# Patient Record
Sex: Male | Born: 1970 | Race: Black or African American | Hispanic: No | Marital: Single | Smoking: Never smoker
Health system: Southern US, Community
[De-identification: ages and names within clinical notes are randomized; demographics above are authoritative.]

## PROBLEM LIST (undated history)

## (undated) DIAGNOSIS — E0590 Thyrotoxicosis, unspecified without thyrotoxic crisis or storm: Secondary | ICD-10-CM

## (undated) DIAGNOSIS — E041 Nontoxic single thyroid nodule: Secondary | ICD-10-CM

## (undated) DIAGNOSIS — E119 Type 2 diabetes mellitus without complications: Secondary | ICD-10-CM

## (undated) HISTORY — PX: APPENDECTOMY: SHX54

## (undated) HISTORY — PX: ABDOMINAL SURGERY: SHX537

## (undated) HISTORY — PX: HERNIA REPAIR: SHX51

---

## 2004-01-10 ENCOUNTER — Other Ambulatory Visit: Payer: Self-pay

## 2005-02-06 ENCOUNTER — Emergency Department: Payer: Self-pay | Admitting: Emergency Medicine

## 2005-07-23 ENCOUNTER — Emergency Department: Payer: Self-pay | Admitting: Internal Medicine

## 2006-12-14 ENCOUNTER — Emergency Department: Payer: Self-pay | Admitting: Emergency Medicine

## 2007-01-11 ENCOUNTER — Emergency Department: Payer: Self-pay | Admitting: Emergency Medicine

## 2008-08-09 ENCOUNTER — Emergency Department: Payer: Self-pay | Admitting: Emergency Medicine

## 2009-02-09 ENCOUNTER — Emergency Department: Payer: Self-pay | Admitting: Internal Medicine

## 2009-02-23 ENCOUNTER — Emergency Department: Payer: Self-pay | Admitting: Emergency Medicine

## 2009-04-10 ENCOUNTER — Emergency Department: Payer: Self-pay | Admitting: Emergency Medicine

## 2009-06-09 ENCOUNTER — Emergency Department: Payer: Self-pay | Admitting: Emergency Medicine

## 2009-11-29 ENCOUNTER — Emergency Department: Payer: Self-pay | Admitting: Emergency Medicine

## 2010-05-19 ENCOUNTER — Emergency Department: Payer: Self-pay | Admitting: Emergency Medicine

## 2010-10-03 DIAGNOSIS — E0590 Thyrotoxicosis, unspecified without thyrotoxic crisis or storm: Secondary | ICD-10-CM

## 2011-03-13 ENCOUNTER — Emergency Department: Payer: Self-pay | Admitting: Emergency Medicine

## 2011-05-19 ENCOUNTER — Emergency Department: Payer: Self-pay | Admitting: Emergency Medicine

## 2013-04-21 DIAGNOSIS — R351 Nocturia: Secondary | ICD-10-CM

## 2013-04-21 DIAGNOSIS — M25561 Pain in right knee: Secondary | ICD-10-CM

## 2013-04-21 DIAGNOSIS — E669 Obesity, unspecified: Secondary | ICD-10-CM | POA: Insufficient documentation

## 2013-04-21 DIAGNOSIS — M549 Dorsalgia, unspecified: Secondary | ICD-10-CM | POA: Insufficient documentation

## 2013-04-21 DIAGNOSIS — M25562 Pain in left knee: Secondary | ICD-10-CM | POA: Insufficient documentation

## 2013-04-21 DIAGNOSIS — N401 Enlarged prostate with lower urinary tract symptoms: Secondary | ICD-10-CM | POA: Insufficient documentation

## 2013-08-25 DIAGNOSIS — R5383 Other fatigue: Secondary | ICD-10-CM | POA: Insufficient documentation

## 2013-08-25 DIAGNOSIS — R51 Headache: Secondary | ICD-10-CM

## 2013-08-25 DIAGNOSIS — K5900 Constipation, unspecified: Secondary | ICD-10-CM | POA: Insufficient documentation

## 2014-07-08 ENCOUNTER — Emergency Department: Payer: Self-pay | Admitting: Emergency Medicine

## 2015-04-07 DIAGNOSIS — E119 Type 2 diabetes mellitus without complications: Secondary | ICD-10-CM | POA: Insufficient documentation

## 2015-07-25 ENCOUNTER — Encounter: Payer: Self-pay | Admitting: Emergency Medicine

## 2015-07-25 ENCOUNTER — Emergency Department: Payer: 59

## 2015-07-25 ENCOUNTER — Emergency Department
Admission: EM | Admit: 2015-07-25 | Discharge: 2015-07-25 | Disposition: A | Discharge status: Home / Self Care | Payer: 59 | Attending: Emergency Medicine | Admitting: Emergency Medicine

## 2015-07-25 DIAGNOSIS — R197 Diarrhea, unspecified: Secondary | ICD-10-CM | POA: Insufficient documentation

## 2015-07-25 DIAGNOSIS — Z79899 Other long term (current) drug therapy: Secondary | ICD-10-CM | POA: Diagnosis not present

## 2015-07-25 DIAGNOSIS — R112 Nausea with vomiting, unspecified: Secondary | ICD-10-CM | POA: Insufficient documentation

## 2015-07-25 DIAGNOSIS — R109 Unspecified abdominal pain: Secondary | ICD-10-CM | POA: Insufficient documentation

## 2015-07-25 LAB — URINALYSIS COMPLETE WITH MICROSCOPIC (ARMC ONLY)
BACTERIA UA: NONE SEEN
Bilirubin Urine: NEGATIVE
GLUCOSE, UA: NEGATIVE mg/dL
Hgb urine dipstick: NEGATIVE
Ketones, ur: NEGATIVE mg/dL
Leukocytes, UA: NEGATIVE
NITRITE: NEGATIVE
Protein, ur: NEGATIVE mg/dL
SPECIFIC GRAVITY, URINE: 1.026 (ref 1.005–1.030)
pH: 5 (ref 5.0–8.0)

## 2015-07-25 LAB — COMPREHENSIVE METABOLIC PANEL
ALBUMIN: 4.3 g/dL (ref 3.5–5.0)
ALT: 27 U/L (ref 17–63)
AST: 28 U/L (ref 15–41)
Alkaline Phosphatase: 84 U/L (ref 38–126)
Anion gap: 5 (ref 5–15)
BUN: 18 mg/dL (ref 6–20)
CHLORIDE: 103 mmol/L (ref 101–111)
CO2: 25 mmol/L (ref 22–32)
Calcium: 9.2 mg/dL (ref 8.9–10.3)
Creatinine, Ser: 1.34 mg/dL — ABNORMAL HIGH (ref 0.61–1.24)
GFR calc Af Amer: >60 mL/min (ref >60)
GFR calc non Af Amer: >60 mL/min (ref >60)
GLUCOSE: 169 mg/dL — AB (ref 65–99)
Potassium: 4.2 mmol/L (ref 3.5–5.1)
Sodium: 133 mmol/L — ABNORMAL LOW (ref 135–145)
Total Bilirubin: 0.4 mg/dL (ref 0.3–1.2)
Total Protein: 8.7 g/dL — ABNORMAL HIGH (ref 6.5–8.1)

## 2015-07-25 LAB — CBC WITH DIFFERENTIAL/PLATELET
BASOS PCT: 0 %
Basophils Absolute: 0 10*3/uL (ref 0–0.1)
EOS ABS: 0.1 10*3/uL (ref 0–0.7)
EOS PCT: 1 %
HEMATOCRIT: 46.5 % (ref 40.0–52.0)
Hemoglobin: 15.3 g/dL (ref 13.0–18.0)
Lymphocytes Relative: 3 %
Lymphs Abs: 0.5 10*3/uL — ABNORMAL LOW (ref 1.0–3.6)
MCH: 28.1 pg (ref 26.0–34.0)
MCHC: 32.9 g/dL (ref 32.0–36.0)
MCV: 85.4 fL (ref 80.0–100.0)
MONO ABS: 0.7 10*3/uL (ref 0.2–1.0)
MONOS PCT: 4 %
NEUTROS ABS: 15.3 10*3/uL — AB (ref 1.4–6.5)
Neutrophils Relative %: 92 %
Platelets: 259 10*3/uL (ref 150–440)
RBC: 5.45 MIL/uL (ref 4.40–5.90)
RDW: 12.9 % (ref 11.5–14.5)
WBC: 16.7 10*3/uL — ABNORMAL HIGH (ref 3.8–10.6)

## 2015-07-25 LAB — LIPASE, BLOOD: Lipase: 16 U/L — ABNORMAL LOW (ref 22–51)

## 2015-07-25 MED ORDER — ONDANSETRON HCL 4 MG PO TABS: 4.0000 mg | ORAL_TABLET | Freq: Every day | ORAL | Status: DC | PRN

## 2015-07-25 MED ORDER — SODIUM CHLORIDE 0.9 % IV SOLN
1000.0000 mL | Freq: Once | INTRAVENOUS | Status: AC
  Administered 2015-07-25: 1000 mL via INTRAVENOUS

## 2015-07-25 MED ORDER — KETOROLAC TROMETHAMINE 30 MG/ML IJ SOLN
INTRAMUSCULAR | Status: AC
  Administered 2015-07-25: 30 mg via INTRAVENOUS
  Filled 2015-07-25: qty 1

## 2015-07-25 MED ORDER — KETOROLAC TROMETHAMINE 30 MG/ML IJ SOLN
30.0000 mg | Freq: Once | INTRAMUSCULAR | Status: AC
  Administered 2015-07-25: 30 mg via INTRAVENOUS

## 2015-07-25 MED ORDER — ONDANSETRON HCL 4 MG/2ML IJ SOLN
4.0000 mg | Freq: Once | INTRAMUSCULAR | Status: AC
  Administered 2015-07-25: 4 mg via INTRAVENOUS
  Filled 2015-07-25: qty 2

## 2015-07-25 NOTE — ED Notes (Signed)
DR Cyril Loosen at bedside to update pt.

## 2015-07-25 NOTE — ED Notes (Signed)
Patient transported to X-ray 

## 2015-07-25 NOTE — Discharge Instructions (Signed)
Nausea and Vomiting  Nausea means you feel sick to your stomach. Throwing up (vomiting) is a reflex where stomach contents come out of your mouth.  HOME CARE   · Take medicine as told by your doctor.  · Do not force yourself to eat. However, you do need to drink fluids.  · If you feel like eating, eat a normal diet as told by your doctor.    Eat rice, wheat, potatoes, bread, lean meats, yogurt, fruits, and vegetables.    Avoid high-fat foods.  · Drink enough fluids to keep your pee (urine) clear or pale yellow.  · Ask your doctor how to replace body fluid losses (rehydrate). Signs of body fluid loss (dehydration) include:    Feeling very thirsty.    Dry lips and mouth.    Feeling dizzy.    Dark pee.    Peeing less than normal.    Feeling confused.    Fast breathing or heart rate.  GET HELP RIGHT AWAY IF:   · You have blood in your throw up.  · You have black or bloody poop (stool).  · You have a bad headache or stiff neck.  · You feel confused.  · You have bad belly (abdominal) pain.  · You have chest pain or trouble breathing.  · You do not pee at least once every 8 hours.  · You have cold, clammy skin.  · You keep throwing up after 24 to 48 hours.  · You have a fever.  MAKE SURE YOU:   · Understand these instructions.  · Will watch your condition.  · Will get help right away if you are not doing well or get worse.     This information is not intended to replace advice given to you by your health care provider. Make sure you discuss any questions you have with your health care provider.     Document Released: 03/18/2008 Document Revised: 12/23/2011 Document Reviewed: 03/01/2011  Elsevier Interactive Patient Education ©2016 [employer]

## 2015-07-25 NOTE — ED Notes (Signed)
Dr. Ether Griffins called report on patient.  Patient was pre-registered.  Time read 12 min. When patient arrived in ED - taken directly to triage by Jeremy Johann RN.

## 2015-07-25 NOTE — ED Notes (Signed)
Pt to ED with c/o upper abd. Pain with n,v,d since last night

## 2015-07-25 NOTE — ED Provider Notes (Signed)
[facility] Emergency Department Provider Note  ____________________________________________  Time seen: On arrival  I have reviewed the triage vital signs and the nursing notes.   HISTORY  Chief Complaint Abdominal Pain   HPI Gary Holloway is a 44 y.o. male who presents with complaints of nausea vomiting and diarrhea since yesterday evening. He reports he ate at [employer] and started feeling sick approximately an hour afterwards and developed diarrhea and nausea and vomiting which kept him up most of the night. Does have some mild abdominal cramping but it is not severe he says. His worse symptom is his nausea. He was sent to the ED because he does have history of a bowel obstruction but he is having frequent diarrhea. No fevers no chills.     History reviewed. No pertinent past medical history.  There are no active problems to display for this patient.   Past Surgical History  Procedure Laterality Date  . Appendectomy    . Abdominal surgery      Current Outpatient Rx  Name  Route  Sig  Dispense  Refill  . ibuprofen (ADVIL,MOTRIN) 200 MG tablet   Oral   Take 200 mg by mouth every 6 (six) hours as needed for fever, headache, mild pain, moderate pain or cramping.          . metFORMIN (GLUCOPHAGE-XR) 750 MG 24 hr tablet   Oral   Take 750 mg by mouth daily with supper.         . promethazine (PHENERGAN) 25 MG/ML injection   Intramuscular   Inject 1 mL into the muscle once.         . ondansetron (ZOFRAN) 4 MG tablet   Oral   Take 1 tablet (4 mg total) by mouth daily as needed for nausea or vomiting.   20 tablet   1     Allergies Iodine  No family history on file.  Social History Social History  Substance Use Topics  . Smoking status: Never Smoker   . Smokeless tobacco: None  . Alcohol Use: Yes     Comment: "rarely"    Review of Systems  Constitutional: Negative for fever. Eyes: Negative for visual changes. ENT: Negative  for sore throat Cardiovascular: Negative for chest pain. Respiratory: Negative for shortness of breath. Gastrointestinal: As above for nausea vomiting and diarrhea Genitourinary: Negative for dysuria. Musculoskeletal: Negative for back pain. Skin: Negative for rash. Neurological: Negative for headaches or focal weakness Psychiatric: No anxiety    ____________________________________________   PHYSICAL EXAM:  VITAL SIGNS: ED Triage Vitals  Enc Vitals Group     BP 07/25/15 0919 124/85 mmHg     Pulse Rate 07/25/15 0915 93     Resp 07/25/15 0915 18     Temp 07/25/15 0915 98.4 F (36.9 C)     Temp Source 07/25/15 0915 Oral     SpO2 07/25/15 0915 97 %     Weight 07/25/15 0915 297 lb (134.718 kg)     Height 07/25/15 0915  (1.803 m)     Head Cir --      Peak Flow --      Pain Score 07/25/15 0917 5     Pain Loc --      Pain Edu? --      Excl. in GC? --      Constitutional: Alert and oriented. Well appearing and in no distress. Eyes: Conjunctivae are normal.  ENT   Head: Normocephalic and atraumatic.   Mouth/Throat:  Mucous membranes are moist. Cardiovascular: Normal rate, regular rhythm. Normal and symmetric distal pulses are present in all extremities. No murmurs, rubs, or gallops. Respiratory: Normal respiratory effort without tachypnea nor retractions. Breath sounds are clear and equal bilaterally.  Gastrointestinal: Soft and non-tender in all quadrants. No distention. There is no CVA tenderness. Genitourinary: deferred Musculoskeletal: Nontender with normal range of motion in all extremities. No lower extremity tenderness nor edema. Neurologic:  Normal speech and language. No gross focal neurologic deficits are appreciated. Skin:  Skin is warm, dry and intact. No rash noted. Psychiatric: Mood and affect are normal. Patient exhibits appropriate insight and judgment.  ____________________________________________    LABS (pertinent  positives/negatives)  Labs Reviewed  COMPREHENSIVE METABOLIC PANEL - Abnormal; Notable for the following:    Sodium 133 (*)    Glucose, Bld 169 (*)    Creatinine, Ser 1.34 (*)    Total Protein 8.7 (*)    All other components within normal limits  LIPASE, BLOOD - Abnormal; Notable for the following:    Lipase 16 (*)    All other components within normal limits  CBC WITH DIFFERENTIAL/PLATELET - Abnormal; Notable for the following:    WBC 16.7 (*)    Neutro Abs 15.3 (*)    Lymphs Abs 0.5 (*)    All other components within normal limits  URINALYSIS COMPLETEWITH MICROSCOPIC ([facility] ONLY) - Abnormal; Notable for the following:    Color, Urine YELLOW (*)    APPearance CLEAR (*)    Squamous Epithelial / LPF 0-5 (*)    All other components within normal limits    ____________________________________________   EKG  ED ECG REPORT I, Jene Every, the attending physician, personally viewed and interpreted this ECG.  Date: 07/25/2015 EKG Time: 9:25 AM Rate: 93 Rhythm: normal sinus rhythm QRS Axis: normal Intervals: normal ST/T Wave abnormalities: normal Conduction Disutrbances: none Narrative Interpretation: unremarkable   ____________________________________________    RADIOLOGY I have personally reviewed any xrays that were ordered on this patient: Normal bowel gas pattern on abdominal x-ray  ____________________________________________   PROCEDURES  Procedure(s) performed: none  Critical Care performed: none  ____________________________________________   INITIAL IMPRESSION / ASSESSMENT AND PLAN / ED COURSE  Pertinent labs & imaging results that were available during my care of the patient were reviewed by me and considered in my medical decision making (see chart for details).  Initial impression is consistent with gastroenteritis versus food poisoning versus possibility of small bowel obstruction. We will treat with fluids and Zofran  Abdominal x-ray  shows normal bowel gas pattern  Patient feeling significant better after fluids and Zofran. I'll discharge him home with upper prescription for Zofran. We discussed return precautions. He agrees with plan  ____________________________________________   FINAL CLINICAL IMPRESSION(S) / ED DIAGNOSES  Final diagnoses:  Abdominal pain  Nausea vomiting and diarrhea     Jene Every, MD 07/25/15 1541

## 2015-11-28 DIAGNOSIS — E042 Nontoxic multinodular goiter: Secondary | ICD-10-CM | POA: Insufficient documentation

## 2016-10-09 ENCOUNTER — Emergency Department: Payer: 59

## 2016-10-09 ENCOUNTER — Inpatient Hospital Stay
Admission: EM | Admit: 2016-10-09 | Discharge: 2016-10-11 | DRG: 439 | Disposition: A | Discharge status: Home / Self Care | Payer: 59 | Attending: Internal Medicine | Admitting: Internal Medicine

## 2016-10-09 ENCOUNTER — Encounter: Payer: Self-pay | Admitting: Emergency Medicine

## 2016-10-09 DIAGNOSIS — K859 Acute pancreatitis without necrosis or infection, unspecified: Principal | ICD-10-CM

## 2016-10-09 DIAGNOSIS — Z6839 Body mass index (BMI) 39.0-39.9, adult: Secondary | ICD-10-CM

## 2016-10-09 DIAGNOSIS — Z841 Family history of disorders of kidney and ureter: Secondary | ICD-10-CM | POA: Diagnosis not present

## 2016-10-09 DIAGNOSIS — K5900 Constipation, unspecified: Secondary | ICD-10-CM | POA: Diagnosis present

## 2016-10-09 DIAGNOSIS — Z8249 Family history of ischemic heart disease and other diseases of the circulatory system: Secondary | ICD-10-CM

## 2016-10-09 DIAGNOSIS — Z7982 Long term (current) use of aspirin: Secondary | ICD-10-CM

## 2016-10-09 DIAGNOSIS — Z7984 Long term (current) use of oral hypoglycemic drugs: Secondary | ICD-10-CM | POA: Diagnosis not present

## 2016-10-09 DIAGNOSIS — Z833 Family history of diabetes mellitus: Secondary | ICD-10-CM

## 2016-10-09 DIAGNOSIS — Z888 Allergy status to other drugs, medicaments and biological substances status: Secondary | ICD-10-CM | POA: Diagnosis not present

## 2016-10-09 DIAGNOSIS — E119 Type 2 diabetes mellitus without complications: Secondary | ICD-10-CM | POA: Diagnosis present

## 2016-10-09 DIAGNOSIS — F1010 Alcohol abuse, uncomplicated: Secondary | ICD-10-CM

## 2016-10-09 DIAGNOSIS — D72829 Elevated white blood cell count, unspecified: Secondary | ICD-10-CM

## 2016-10-09 DIAGNOSIS — R109 Unspecified abdominal pain: Secondary | ICD-10-CM

## 2016-10-09 DIAGNOSIS — E871 Hypo-osmolality and hyponatremia: Secondary | ICD-10-CM | POA: Diagnosis present

## 2016-10-09 HISTORY — DX: Nontoxic single thyroid nodule: E04.1

## 2016-10-09 HISTORY — DX: Type 2 diabetes mellitus without complications: E11.9

## 2016-10-09 HISTORY — DX: Thyrotoxicosis, unspecified without thyrotoxic crisis or storm: E05.90

## 2016-10-09 HISTORY — DX: Morbid (severe) obesity due to excess calories: E66.01

## 2016-10-09 LAB — COMPREHENSIVE METABOLIC PANEL
ALT: 23 U/L (ref 17–63)
ANION GAP: 7 (ref 5–15)
AST: 24 U/L (ref 15–41)
Albumin: 4.1 g/dL (ref 3.5–5.0)
Alkaline Phosphatase: 78 U/L (ref 38–126)
BUN: 13 mg/dL (ref 6–20)
CHLORIDE: 102 mmol/L (ref 101–111)
CO2: 24 mmol/L (ref 22–32)
CREATININE: 1.1 mg/dL (ref 0.61–1.24)
Calcium: 8.8 mg/dL — ABNORMAL LOW (ref 8.9–10.3)
Glucose, Bld: 152 mg/dL — ABNORMAL HIGH (ref 65–99)
POTASSIUM: 3.6 mmol/L (ref 3.5–5.1)
SODIUM: 133 mmol/L — AB (ref 135–145)
Total Bilirubin: 0.6 mg/dL (ref 0.3–1.2)
Total Protein: 7.9 g/dL (ref 6.5–8.1)

## 2016-10-09 LAB — URINALYSIS, COMPLETE (UACMP) WITH MICROSCOPIC
BILIRUBIN URINE: NEGATIVE
Bacteria, UA: NONE SEEN
Glucose, UA: NEGATIVE mg/dL
KETONES UR: NEGATIVE mg/dL
LEUKOCYTES UA: NEGATIVE
Nitrite: NEGATIVE
PH: 5 (ref 5.0–8.0)
PROTEIN: NEGATIVE mg/dL
SQUAMOUS EPITHELIAL / LPF: NONE SEEN
Specific Gravity, Urine: 1.02 (ref 1.005–1.030)

## 2016-10-09 LAB — CBC
HEMATOCRIT: 40.2 % (ref 40.0–52.0)
HEMOGLOBIN: 13.6 g/dL (ref 13.0–18.0)
MCH: 28.5 pg (ref 26.0–34.0)
MCHC: 33.9 g/dL (ref 32.0–36.0)
MCV: 84.1 fL (ref 80.0–100.0)
Platelets: 244 10*3/uL (ref 150–440)
RBC: 4.79 MIL/uL (ref 4.40–5.90)
RDW: 13.3 % (ref 11.5–14.5)
WBC: 15.5 10*3/uL — AB (ref 3.8–10.6)

## 2016-10-09 LAB — GLUCOSE, CAPILLARY
Glucose-Capillary: 139 mg/dL — ABNORMAL HIGH (ref 65–99)
Glucose-Capillary: 123 mg/dL — ABNORMAL HIGH (ref 65–99)
Glucose-Capillary: 108 mg/dL — ABNORMAL HIGH (ref 65–99)

## 2016-10-09 LAB — LIPASE, BLOOD: Lipase: 64 U/L — ABNORMAL HIGH (ref 11–51)

## 2016-10-09 MED ORDER — ASPIRIN EC 81 MG PO TBEC
81.0000 mg | DELAYED_RELEASE_TABLET | Freq: Every day | ORAL | Status: DC
  Administered 2016-10-09 – 2016-10-11 (×3): 81 mg via ORAL
  Filled 2016-10-09 (×3): qty 1

## 2016-10-09 MED ORDER — INSULIN ASPART 100 UNIT/ML ~~LOC~~ SOLN
0.0000 [IU] | Freq: Three times a day (TID) | SUBCUTANEOUS | Status: DC
  Administered 2016-10-09 – 2016-10-11 (×4): 1 [IU] via SUBCUTANEOUS
  Filled 2016-10-09 (×4): qty 1

## 2016-10-09 MED ORDER — MORPHINE SULFATE (PF) 4 MG/ML IV SOLN
INTRAVENOUS | Status: AC
  Filled 2016-10-09: qty 1

## 2016-10-09 MED ORDER — POTASSIUM CHLORIDE IN NACL 40-0.9 MEQ/L-% IV SOLN
INTRAVENOUS | Status: DC
  Administered 2016-10-09 – 2016-10-10 (×3): 125 mL/h via INTRAVENOUS
  Filled 2016-10-09 (×5): qty 1000

## 2016-10-09 MED ORDER — ONDANSETRON HCL 4 MG/2ML IJ SOLN
4.0000 mg | Freq: Once | INTRAMUSCULAR | Status: AC
  Administered 2016-10-09: 4 mg via INTRAVENOUS

## 2016-10-09 MED ORDER — MORPHINE SULFATE (PF) 4 MG/ML IV SOLN
4.0000 mg | Freq: Once | INTRAVENOUS | Status: AC
  Administered 2016-10-09: 4 mg via INTRAVENOUS

## 2016-10-09 MED ORDER — MORPHINE SULFATE (PF) 4 MG/ML IV SOLN
INTRAVENOUS | Status: AC
  Administered 2016-10-09: 4 mg via INTRAVENOUS
  Filled 2016-10-09: qty 1

## 2016-10-09 MED ORDER — KETOROLAC TROMETHAMINE 30 MG/ML IJ SOLN
30.0000 mg | Freq: Four times a day (QID) | INTRAMUSCULAR | Status: DC | PRN
  Administered 2016-10-09 – 2016-10-11 (×8): 30 mg via INTRAVENOUS
  Filled 2016-10-09 (×8): qty 1

## 2016-10-09 MED ORDER — HYDROMORPHONE HCL 1 MG/ML IJ SOLN
1.0000 mg | Freq: Once | INTRAMUSCULAR | Status: AC
  Administered 2016-10-09: 1 mg via INTRAVENOUS

## 2016-10-09 MED ORDER — SODIUM CHLORIDE 0.9 % IV BOLUS (SEPSIS)
1000.0000 mL | Freq: Once | INTRAVENOUS | Status: AC
  Administered 2016-10-09: 1000 mL via INTRAVENOUS

## 2016-10-09 MED ORDER — ONDANSETRON HCL 4 MG/2ML IJ SOLN
INTRAMUSCULAR | Status: AC
  Filled 2016-10-09: qty 2

## 2016-10-09 MED ORDER — INSULIN ASPART 100 UNIT/ML ~~LOC~~ SOLN: 0.0000 [IU] | Freq: Every day | SUBCUTANEOUS | Status: DC

## 2016-10-09 MED ORDER — ENOXAPARIN SODIUM 40 MG/0.4ML ~~LOC~~ SOLN
40.0000 mg | SUBCUTANEOUS | Status: DC
  Administered 2016-10-10: 22:00:00 40 mg via SUBCUTANEOUS
  Filled 2016-10-09 (×2): qty 0.4

## 2016-10-09 MED ORDER — INFLUENZA VAC SPLIT QUAD 0.5 ML IM SUSY: 0.5000 mL | PREFILLED_SYRINGE | INTRAMUSCULAR | Status: DC

## 2016-10-09 MED ORDER — SODIUM CHLORIDE 0.9 % IV SOLN: INTRAVENOUS | Status: DC

## 2016-10-09 MED ORDER — ACETAMINOPHEN 650 MG RE SUPP: 650.0000 mg | Freq: Four times a day (QID) | RECTAL | Status: DC | PRN

## 2016-10-09 MED ORDER — ONDANSETRON HCL 4 MG PO TABS: 4.0000 mg | ORAL_TABLET | Freq: Four times a day (QID) | ORAL | Status: DC | PRN

## 2016-10-09 MED ORDER — HYDROMORPHONE HCL 1 MG/ML IJ SOLN
INTRAMUSCULAR | Status: AC
  Filled 2016-10-09: qty 1

## 2016-10-09 MED ORDER — ALBUTEROL SULFATE (2.5 MG/3ML) 0.083% IN NEBU: 2.5000 mg | INHALATION_SOLUTION | RESPIRATORY_TRACT | Status: DC | PRN

## 2016-10-09 MED ORDER — ONDANSETRON HCL 4 MG/2ML IJ SOLN
4.0000 mg | Freq: Four times a day (QID) | INTRAMUSCULAR | Status: DC | PRN
  Administered 2016-10-09 – 2016-10-10 (×2): 4 mg via INTRAVENOUS
  Filled 2016-10-09 (×2): qty 2

## 2016-10-09 MED ORDER — HYDROCODONE-ACETAMINOPHEN 5-325 MG PO TABS
1.0000 | ORAL_TABLET | ORAL | Status: DC | PRN
Start: 2016-10-09 — End: 2016-10-11

## 2016-10-09 MED ORDER — ACETAMINOPHEN 325 MG PO TABS
650.0000 mg | ORAL_TABLET | Freq: Four times a day (QID) | ORAL | Status: DC | PRN
  Administered 2016-10-10: 650 mg via ORAL
  Filled 2016-10-09: qty 1
  Filled 2016-10-09: qty 2

## 2016-10-09 MED ORDER — PROMETHAZINE HCL 25 MG/ML IJ SOLN
12.5000 mg | Freq: Four times a day (QID) | INTRAMUSCULAR | Status: DC | PRN
  Administered 2016-10-09 (×2): 12.5 mg via INTRAVENOUS
  Filled 2016-10-09 (×2): qty 1

## 2016-10-09 MED ORDER — SENNOSIDES-DOCUSATE SODIUM 8.6-50 MG PO TABS
1.0000 | ORAL_TABLET | Freq: Every evening | ORAL | Status: DC | PRN
  Administered 2016-10-09: 1 via ORAL
  Filled 2016-10-09: qty 1

## 2016-10-09 NOTE — H&P (Signed)
[facility] [place] [facility]   PATIENT NAME: Gary EvesDaniel Holloway    MR#:  [id number]  DATE OF BIRTH:  January 18, 1971  DATE OF ADMISSION:  10/09/2016  PRIMARY CARE PHYSICIAN: [facility]   REQUESTING/REFERRING PHYSICIAN: Dr. Cyril LoosenKinner  CHIEF COMPLAINT:   Chief Complaint  Patient presents with  . Flank Pain    HISTORY OF PRESENT ILLNESS:  Gary EvesDaniel Holloway  is a 45 y.o. male with a known history of Diabetes and morbid obesity. The patient presents to the ED with abdominal pain today, which is constant, sharp, 10 out of 10 with radiation to back. He also complains of nausea but no vomiting or diarrhea. He denies any dysuria or hematuria. CAT scan of the abdomen show acute pancreatitis. He said he only drinks one can of beer yesterday and he doesn't drink alcohol regularly. But he admitted that he eat fatty food.  PAST MEDICAL HISTORY:   Past Medical History:  Diagnosis Date  . Diabetes mellitus without complication (HCC)   . Morbid obesity (HCC)   . Thyroid nodule   . Thyrotoxicosis without thyroid storm     PAST SURGICAL HISTORY:   Past Surgical History:  Procedure Laterality Date  . ABDOMINAL SURGERY    . APPENDECTOMY    . HERNIA REPAIR      SOCIAL HISTORY:   Social History  Substance Use Topics  . Smoking status: Never Smoker  . Smokeless tobacco: Never Used  . Alcohol use Yes     Comment: "rarely"    FAMILY HISTORY:   Family History  Problem Relation Age of Onset  . Diabetes Mother   . Hypertension Mother   . Diabetes Father   . Heart failure Father   . Kidney disease Father     DRUG ALLERGIES:   Allergies  Allergen Reactions  . Iodine Anaphylaxis    REVIEW OF SYSTEMS:   Review of Systems  Constitutional: Positive for malaise/fatigue. Negative for chills and fever.  HENT: Negative for congestion.   Eyes: Negative for blurred vision and double vision.  Respiratory: Negative for cough, shortness of breath and  stridor.   Cardiovascular: Negative for chest pain and leg swelling.  Gastrointestinal: Positive for abdominal pain and nausea. Negative for blood in stool, constipation, diarrhea, melena and vomiting.  Genitourinary: Negative for dysuria and hematuria.  Musculoskeletal: Positive for back pain.  Neurological: Negative for dizziness, focal weakness, loss of consciousness and weakness.  Psychiatric/Behavioral: Negative for depression. The patient is not nervous/anxious.     MEDICATIONS AT HOME:   Prior to Admission medications   Medication Sig Start Date End Date Taking? Authorizing Provider  aspirin EC 81 MG tablet Take 81 mg by mouth daily.   Yes Historical Provider, MD  ibuprofen (ADVIL,MOTRIN) 200 MG tablet Take 200 mg by mouth every 6 (six) hours as needed for fever, headache, mild pain, moderate pain or cramping.    Yes Historical Provider, MD  metFORMIN (GLUCOPHAGE-XR) 750 MG 24 hr tablet Take 750 mg by mouth daily with supper.    Historical Provider, MD  ondansetron (ZOFRAN) 4 MG tablet Take 1 tablet (4 mg total) by mouth daily as needed for nausea or vomiting. Patient not taking: Reported on 10/09/2016 07/25/15   Jene Everyobert Kinner, MD      VITAL SIGNS:  Blood pressure 123/83, pulse 86, temperature 98.1 F (36.7 C), temperature source Oral, resp. rate 17, height 5\' 11"  (1.803 m), weight 280 lb (127 kg), SpO2 93 %.  PHYSICAL EXAMINATION:  Physical Exam  GENERAL:  45 y.o.-year-old patient lying in the bed with no acute distress. Morbidly obese. EYES: Pupils equal, round, reactive to light and accommodation. No scleral icterus. Extraocular muscles intact.  HEENT: Head atraumatic, normocephalic. Oropharynx and nasopharynx clear.  NECK:  Supple, no jugular venous distention. No thyroid enlargement, no tenderness.  LUNGS: Normal breath sounds bilaterally, no wheezing, rales,rhonchi or crepitation. No use of accessory muscles of respiration.  CARDIOVASCULAR: S1, S2 normal. No murmurs,  rubs, or gallops.  ABDOMEN: Soft, tenderness in The middle and epigastric area, nondistended. Bowel sounds present. No organomegaly or mass.  EXTREMITIES: No pedal edema, cyanosis, or clubbing.  NEUROLOGIC: Cranial nerves II through XII are intact. Muscle strength 5/5 in all extremities. Sensation intact. Gait not checked.  PSYCHIATRIC: The patient is alert and oriented x 3.  SKIN: No obvious rash, lesion, or ulcer.   LABORATORY PANEL:   CBC  Recent Labs Lab 10/09/16 0434  WBC 15.5*  HGB 13.6  HCT 40.2  PLT 244   ------------------------------------------------------------------------------------------------------------------  Chemistries   Recent Labs Lab 10/09/16 0434  NA 133*  K 3.6  CL 102  CO2 24  GLUCOSE 152*  BUN 13  CREATININE 1.10  CALCIUM 8.8*  AST 24  ALT 23  ALKPHOS 78  BILITOT 0.6   ------------------------------------------------------------------------------------------------------------------  Cardiac Enzymes No results for input(s): TROPONINI in the last 168 hours. ------------------------------------------------------------------------------------------------------------------  RADIOLOGY:  Ct Renal Stone Study  Result Date: 10/09/2016 CLINICAL DATA:  Left flank pain and nausea. EXAM: CT ABDOMEN AND PELVIS WITHOUT CONTRAST TECHNIQUE: Multidetector CT imaging of the abdomen and pelvis was performed following the standard protocol without IV contrast. COMPARISON:  CT 06/10/2009 FINDINGS: Lower chest: Mild upper and atelectasis.  No pleural fluid. Hepatobiliary: Hepatomegaly. Hepatic steatosis with focal fatty sparing adjacent to the gallbladder fossa. Question intraluminal sludge, no calcified stone. No biliary dilatation. Pancreas: Peripancreatic edema and soft tissue stranding about the distal body and tail. No ductal dilatation. No peripancreatic fluid collection. Cannot assess for enhancement given lack of contrast. Spleen: Upper normal in size. No  focal abnormality on noncontrast exam. Adrenals/Urinary Tract: No adrenal nodule. No hydronephrosis or perinephric edema. No urolithiasis. Both ureters are decompressed. Urinary bladder is minimally distended. No bladder stone. Stomach/Bowel: The stomach is decompressed. No small bowel dilatation or inflammation. Small volume of colonic stool. Appendix not confidently identified, no pericecal inflammation. Vascular/Lymphatic: No significant vascular findings are present. No enlarged abdominal or pelvic lymph nodes. Reproductive: Prostate is unremarkable. Other: No ascites or free air. Minimal fat in the left greater than right inguinal canal. Musculoskeletal: There are no acute or suspicious osseous abnormalities. Scattered degenerative change in the spine. IMPRESSION: 1. Acute edematous pancreatitis. 2.  No renal stones or obstructive uropathy. 3. Hepatic steatosis. Electronically Signed   By: Rubye OaksMelanie  Ehinger M.D.   On: 10/09/2016 05:56   [place] Abdomen Limited Ruq  Result Date: 10/09/2016 CLINICAL DATA:  Pancreatitis. EXAM: US ABDOMEN LIMITED - RIGHT UPPER QUADRANT COMPARISON:  CT 10/09/2016. FINDINGS: Gallbladder: No gallstones or wall thickening visualized. No sonographic Murphy sign noted by sonographer. Common bile duct: Diameter: 3.3 mm Liver: There is echogenic consistent fatty infiltration and/or hepatocellular disease. IMPRESSION: 1. Liver is echogenic consistent fatty infiltration and/or hepatocellular disease. 2. No acute or focal abnormality. Electronically Signed   By: Maisie Fushomas  Register   On: 10/09/2016 07:52      IMPRESSION AND PLAN:   Acute pancreatitis. The patient will be admitted to medical floor. Keep nothing by mouth except medications and water,  pain control, check lipid panel. IV fluid to support. Leukocytosis. Possible due to reaction to pancreatitis. Follow-up CBC. Hyponatremia. Start normal saline, follow-up BMP. Diabetes. Hold metformin and start sliding scale.   All the  records are reviewed and case discussed with ED provider. Management plans discussed with the patient, His wife and they are in agreement.  CODE STATUS: Full code  TOTAL TIME TAKING CARE OF THIS PATIENT:50 minutes.    Shaune Pollack M.D on 10/09/2016 at 8:56 AM  Between 7am to 6pm - Pager - [phone]  After 6pm go to [email or web address] - Social research officer, government  [facility] [place]  Office  [phone]  CC: Primary care physician; [facility]   Note: This dictation was prepared with Dragon dictation along with smaller phrase technology. Any transcriptional errors that result from this process are unintentional.

## 2016-10-09 NOTE — ED Provider Notes (Signed)
[facility] Emergency Department Provider Note    First MD Initiated Contact with Patient 10/09/16 0421     (approximate)  I have reviewed the triage vital signs and the nursing notes.   HISTORY  Chief Complaint Flank Pain    HPI Gary Holloway is a 45 y.o. male presents emergency department with acute onset of left flank pain that is currently 10 out of 10. Patient stated symptoms started approximately 1 hour before arrival to the emergency department. Patient denies any urinary symptoms no hematuria dysuria or frequency or urgency. Patient denies any fever afebrile on presentation.   Past Medical History:  Diagnosis Date  . Diabetes mellitus without complication (HCC)   . Morbid obesity (HCC)   . Thyroid nodule   . Thyrotoxicosis without thyroid storm     Patient Active Problem List   Diagnosis Date Noted  . Acute pancreatitis 10/09/2016    Past Surgical History:  Procedure Laterality Date  . ABDOMINAL SURGERY    . APPENDECTOMY    . HERNIA REPAIR      Prior to Admission medications   Medication Sig Start Date End Date Taking? Authorizing Provider  aspirin EC 81 MG tablet Take 81 mg by mouth daily.   Yes Historical Provider, MD  ibuprofen (ADVIL,MOTRIN) 200 MG tablet Take 200 mg by mouth every 6 (six) hours as needed for fever, headache, mild pain, moderate pain or cramping.    Yes Historical Provider, MD  metFORMIN (GLUCOPHAGE-XR) 750 MG 24 hr tablet Take 750 mg by mouth daily with supper.   Yes Historical Provider, MD  lovastatin (MEVACOR) 10 MG tablet Take 1 tablet by mouth daily. 08/06/16   Historical Provider, MD  ondansetron (ZOFRAN) 4 MG tablet Take 1 tablet (4 mg total) by mouth daily as needed for nausea or vomiting. Patient not taking: Reported on 10/09/2016 07/25/15   Jene Everyobert Kinner, MD    Allergies Iodine  Family History  Problem Relation Age of Onset  . Diabetes Mother   . Hypertension Mother   . Diabetes Father   . Heart  failure Father   . Kidney disease Father     Social History Social History  Substance Use Topics  . Smoking status: Never Smoker  . Smokeless tobacco: Never Used  . Alcohol use Yes     Comment: "rarely"    Review of Systems Constitutional: No fever/chills Eyes: No visual changes. ENT: No sore throat. Cardiovascular: Denies chest pain. Respiratory: Denies shortness of breath. Gastrointestinal: No abdominal pain.  No nausea, no vomiting.  No diarrhea.  No constipation. Genitourinary: Negative for dysuria.Positive for left flank pain Musculoskeletal: Negative for back pain. Skin: Negative for rash. Neurological: Negative for headaches, focal weakness or numbness.  10-point ROS otherwise negative.  ____________________________________________   PHYSICAL EXAM:  VITAL SIGNS: ED Triage Vitals [10/09/16 0423]  Enc Vitals Group     BP 127/76     Pulse Rate 78     Resp 18     Temp 98.1 F (36.7 C)     Temp Source Oral     SpO2 97 %     Weight 280 lb (127 kg)     Height 5\' 11"  (1.803 m)     Head Circumference      Peak Flow      Pain Score 8     Pain Loc      Pain Edu?      Excl. in GC?     Constitutional: Alert and oriented.  Well appearing and in no acute distress. Eyes: Conjunctivae are normal. PERRL. EOMI. Head: Atraumatic. Mouth/Throat: Mucous membranes are moist.  Oropharynx non-erythematous. Neck: No stridor.   Cardiovascular: Normal rate, regular rhythm. Good peripheral circulation. Grossly normal heart sounds. Respiratory: Normal respiratory effort.  No retractions. Lungs CTAB. Gastrointestinal: Soft and nontender. No distention.  Musculoskeletal: No lower extremity tenderness nor edema. No gross deformities of extremities. Neurologic:  Normal speech and language. No gross focal neurologic deficits are appreciated.  Skin:  Skin is warm, dry and intact. No rash noted.   ____________________________________________   LABS (all labs ordered are listed,  but only abnormal results are displayed)  Labs Reviewed  COMPREHENSIVE METABOLIC PANEL - Abnormal; Notable for the following:       Result Value   Sodium 133 (*)    Glucose, Bld 152 (*)    Calcium 8.8 (*)    All other components within normal limits  CBC - Abnormal; Notable for the following:    WBC 15.5 (*)    All other components within normal limits  URINALYSIS, COMPLETE (UACMP) WITH MICROSCOPIC - Abnormal; Notable for the following:    Color, Urine YELLOW (*)    APPearance CLEAR (*)    Hgb urine dipstick SMALL (*)    All other components within normal limits  LIPASE, BLOOD - Abnormal; Notable for the following:    Lipase 64 (*)    All other components within normal limits  GLUCOSE, CAPILLARY - Abnormal; Notable for the following:    Glucose-Capillary 139 (*)    All other components within normal limits  GLUCOSE, CAPILLARY - Abnormal; Notable for the following:    Glucose-Capillary 123 (*)    All other components within normal limits  GLUCOSE, CAPILLARY - Abnormal; Notable for the following:    Glucose-Capillary 108 (*)    All other components within normal limits  BASIC METABOLIC PANEL  CBC  HEMOGLOBIN A1C  LIPID PANEL    RADIOLOGY I, [place] N BROWN, personally viewed and evaluated these images (plain radiographs) as part of my medical decision making, as well as reviewing the written report by the radiologist.  Ct Renal Stone Study  Result Date: 10/09/2016 CLINICAL DATA:  Left flank pain and nausea. EXAM: CT ABDOMEN AND PELVIS WITHOUT CONTRAST TECHNIQUE: Multidetector CT imaging of the abdomen and pelvis was performed following the standard protocol without IV contrast. COMPARISON:  CT 06/10/2009 FINDINGS: Lower chest: Mild upper and atelectasis.  No pleural fluid. Hepatobiliary: Hepatomegaly. Hepatic steatosis with focal fatty sparing adjacent to the gallbladder fossa. Question intraluminal sludge, no calcified stone. No biliary dilatation. Pancreas: Peripancreatic  edema and soft tissue stranding about the distal body and tail. No ductal dilatation. No peripancreatic fluid collection. Cannot assess for enhancement given lack of contrast. Spleen: Upper normal in size. No focal abnormality on noncontrast exam. Adrenals/Urinary Tract: No adrenal nodule. No hydronephrosis or perinephric edema. No urolithiasis. Both ureters are decompressed. Urinary bladder is minimally distended. No bladder stone. Stomach/Bowel: The stomach is decompressed. No small bowel dilatation or inflammation. Small volume of colonic stool. Appendix not confidently identified, no pericecal inflammation. Vascular/Lymphatic: No significant vascular findings are present. No enlarged abdominal or pelvic lymph nodes. Reproductive: Prostate is unremarkable. Other: No ascites or free air. Minimal fat in the left greater than right inguinal canal. Musculoskeletal: There are no acute or suspicious osseous abnormalities. Scattered degenerative change in the spine. IMPRESSION: 1. Acute edematous pancreatitis. 2.  No renal stones or obstructive uropathy. 3. Hepatic steatosis. Electronically Signed  By: Rubye Oaks M.D.   On: 10/09/2016 05:56   US Abdomen Limited Ruq  Result Date: 10/09/2016 CLINICAL DATA:  Pancreatitis. EXAM: US ABDOMEN LIMITED - RIGHT UPPER QUADRANT COMPARISON:  CT 10/09/2016. FINDINGS: Gallbladder: No gallstones or wall thickening visualized. No sonographic Murphy sign noted by sonographer. Common bile duct: Diameter: 3.3 mm Liver: There is echogenic consistent fatty infiltration and/or hepatocellular disease. IMPRESSION: 1. Liver is echogenic consistent fatty infiltration and/or hepatocellular disease. 2. No acute or focal abnormality. Electronically Signed   By: Maisie Fus  Register   On: 10/09/2016 07:52     Procedures     INITIAL IMPRESSION / ASSESSMENT AND PLAN / ED COURSE  Pertinent labs & imaging results that were available during my care of the patient were reviewed by me and  considered in my medical decision making (see chart for details).  45 year old male presented to the emergency department with abdominal pain. Patient doesn't have an elevated lipase on laboratory data as well as elevated WBC level. CT scan revealed edematous pancreatitis. Patient given multiple doses of IV analgesic in the emergency department without improvement of pain. Patient's care transferred to Dr. Cyril Loosen   Clinical Course     ____________________________________________  FINAL CLINICAL IMPRESSION(S) / ED DIAGNOSES  Final diagnoses:  Pancreatitis  Acute pancreatitis, unspecified complication status, unspecified pancreatitis type     MEDICATIONS GIVEN DURING THIS VISIT:  Medications  acetaminophen (TYLENOL) tablet 650 mg (not administered)    Or  acetaminophen (TYLENOL) suppository 650 mg (not administered)  ondansetron (ZOFRAN) tablet 4 mg ( Oral See Alternative 10/09/16 1618)    Or  ondansetron (ZOFRAN) injection 4 mg (4 mg Intravenous Given 10/09/16 1618)  albuterol (PROVENTIL) (2.5 MG/3ML) 0.083% nebulizer solution 2.5 mg (not administered)  enoxaparin (LOVENOX) injection 40 mg (40 mg Subcutaneous Not Given 10/09/16 2206)  0.9 % NaCl with KCl 40 mEq / L  infusion (125 mL/hr Intravenous New Bag/Given 10/10/16 0131)  HYDROcodone-acetaminophen (NORCO/VICODIN) 5-325 MG per tablet 1-2 tablet (not administered)  ketorolac (TORADOL) 30 MG/ML injection 30 mg (30 mg Intravenous Given 10/10/16 0414)  senna-docusate (Senokot-S) tablet 1 tablet (1 tablet Oral Given 10/09/16 0943)  aspirin EC tablet 81 mg (81 mg Oral Given 10/09/16 0916)  insulin aspart (novoLOG) injection 0-9 Units (1 Units Subcutaneous Given 10/09/16 1709)  insulin aspart (novoLOG) injection 0-5 Units (0 Units Subcutaneous Not Given 10/09/16 2206)  Influenza vac split quadrivalent PF (FLUARIX) injection 0.5 mL (not administered)  promethazine (PHENERGAN) injection 12.5 mg (12.5 mg Intravenous Given 10/09/16  2043)  sodium chloride 0.9 % bolus 1,000 mL (0 mLs Intravenous Stopped 10/09/16 0553)  morphine 4 MG/ML injection 4 mg ( Intravenous Not Given 10/09/16 2255)  ondansetron (ZOFRAN) injection 4 mg ( Intravenous Not Given 10/09/16 2255)  morphine 4 MG/ML injection 4 mg (4 mg Intravenous Given 10/09/16 0615)  ondansetron (ZOFRAN) injection 4 mg (4 mg Intravenous Given 10/09/16 0615)  HYDROmorphone (DILAUDID) injection 1 mg ( Intravenous Not Given 10/09/16 2254)  ondansetron (ZOFRAN) injection 4 mg ( Intravenous Not Given 10/09/16 2254)     NEW OUTPATIENT MEDICATIONS STARTED DURING THIS VISIT:  Current Discharge Medication List      Current Discharge Medication List      Current Discharge Medication List       Note:  This document was prepared using Dragon voice recognition software and may include unintentional dictation errors.    Darci Current, MD 10/10/16 [phone]

## 2016-10-09 NOTE — ED Notes (Signed)
Report received 

## 2016-10-09 NOTE — ED Notes (Signed)
Waiting for US. Pt warm and dry, appears mildly pale. NAD. Respirations unlabored. Abdomen soft.

## 2016-10-09 NOTE — ED Notes (Signed)
MD Brown at bedside.

## 2016-10-09 NOTE — ED Triage Notes (Signed)
Pt presents to ED with left sided flank pain and nausea. Sudden onset around 1700 yesterday. Denies similar symptoms previously. No kidney stones or chronic back pain.

## 2016-10-10 ENCOUNTER — Inpatient Hospital Stay: Payer: 59

## 2016-10-10 DIAGNOSIS — D72829 Elevated white blood cell count, unspecified: Secondary | ICD-10-CM

## 2016-10-10 DIAGNOSIS — F1010 Alcohol abuse, uncomplicated: Secondary | ICD-10-CM

## 2016-10-10 DIAGNOSIS — E119 Type 2 diabetes mellitus without complications: Secondary | ICD-10-CM

## 2016-10-10 DIAGNOSIS — E871 Hypo-osmolality and hyponatremia: Secondary | ICD-10-CM

## 2016-10-10 LAB — CBC
HCT: 36.9 % — ABNORMAL LOW (ref 40.0–52.0)
Hemoglobin: 12.3 g/dL — ABNORMAL LOW (ref 13.0–18.0)
MCH: 28.6 pg (ref 26.0–34.0)
MCHC: 33.3 g/dL (ref 32.0–36.0)
MCV: 86 fL (ref 80.0–100.0)
PLATELETS: 207 10*3/uL (ref 150–440)
RBC: 4.29 MIL/uL — ABNORMAL LOW (ref 4.40–5.90)
RDW: 12.9 % (ref 11.5–14.5)
WBC: 18.3 10*3/uL — ABNORMAL HIGH (ref 3.8–10.6)

## 2016-10-10 LAB — LIPASE, BLOOD: LIPASE: 31 U/L (ref 11–51)

## 2016-10-10 LAB — LIPID PANEL
CHOL/HDL RATIO: 2.6 ratio
Cholesterol: 113 mg/dL (ref 0–200)
HDL: 44 mg/dL (ref >40)
LDL CALC: 60 mg/dL (ref 0–99)
Triglycerides: 44 mg/dL (ref <150)
VLDL: 9 mg/dL (ref 0–40)

## 2016-10-10 LAB — BASIC METABOLIC PANEL
Anion gap: 4 — ABNORMAL LOW (ref 5–15)
BUN: 13 mg/dL (ref 6–20)
CALCIUM: 8.4 mg/dL — AB (ref 8.9–10.3)
CHLORIDE: 106 mmol/L (ref 101–111)
CO2: 25 mmol/L (ref 22–32)
CREATININE: 1.13 mg/dL (ref 0.61–1.24)
GFR calc non Af Amer: >60 mL/min (ref >60)
GLUCOSE: 126 mg/dL — AB (ref 65–99)
Potassium: 4 mmol/L (ref 3.5–5.1)
Sodium: 135 mmol/L (ref 135–145)

## 2016-10-10 LAB — GLUCOSE, CAPILLARY
GLUCOSE-CAPILLARY: 102 mg/dL — AB (ref 65–99)
GLUCOSE-CAPILLARY: 108 mg/dL — AB (ref 65–99)
GLUCOSE-CAPILLARY: 103 mg/dL — AB (ref 65–99)
Glucose-Capillary: 123 mg/dL — ABNORMAL HIGH (ref 65–99)

## 2016-10-10 MED ORDER — SODIUM CHLORIDE 0.9 % IV SOLN
INTRAVENOUS | Status: AC
  Administered 2016-10-10 – 2016-10-11 (×3): via INTRAVENOUS

## 2016-10-10 MED ORDER — ONDANSETRON HCL 4 MG PO TABS: 4.0000 mg | ORAL_TABLET | Freq: Four times a day (QID) | ORAL | 0 refills | Status: DC | PRN

## 2016-10-10 MED ORDER — PANTOPRAZOLE SODIUM 40 MG PO TBEC
40.0000 mg | DELAYED_RELEASE_TABLET | Freq: Two times a day (BID) | ORAL | Status: DC
  Administered 2016-10-10 – 2016-10-11 (×2): 40 mg via ORAL
  Filled 2016-10-10 (×2): qty 1

## 2016-10-10 MED ORDER — HYDROCODONE-ACETAMINOPHEN 5-325 MG PO TABS: 1.0000 | ORAL_TABLET | ORAL | 0 refills | Status: DC | PRN

## 2016-10-10 MED ORDER — SENNOSIDES-DOCUSATE SODIUM 8.6-50 MG PO TABS: 1.0000 | ORAL_TABLET | Freq: Every evening | ORAL | 5 refills | Status: AC | PRN

## 2016-10-10 NOTE — Progress Notes (Addendum)
Pt complained of chest heaviness and abdominal pain. Pt states he feel short of breath.  98% on room air, HR 99-101. Gave pt Toradol IV push.  Pt refusing oral Norco because of frequent bouts of nausea.  Rechecked pt after 20 minutes and he is asleep.  Gently woke pt and asked if he still had chest pain.  Pt stated no and was able to fall back to sleep. Paged [facility] doc and awaiting call back.  Henriette CombsSarah Heloise Gordan RN  Spoke with Dr Sheryle Hailiamond and if pt wakes with new complain of chest pain we are to notify [facility] doc again.  Continue to monitor. Henriette CombsSarah Dantonio Justen RN

## 2016-10-10 NOTE — Progress Notes (Signed)
[facility] [place] [facility]   PATIENT NAME: Gary Holloway    MR#:  [id number]  DATE OF BIRTH:  01/04/1971  DATE OF ADMISSION:  10/09/2016  PRIMARY CARE PHYSICIAN: [facility]   REQUESTING/REFERRING PHYSICIAN: Dr. Cyril LoosenKinner  CHIEF COMPLAINT:      Chief Complaint  Patient presents with  . Flank Pain    HISTORY OF PRESENT ILLNESS:  Gary EvesDaniel Branscomb  is a 45 y.o. male with a known history of Diabetes and morbid obesity. The patient presents to the ED with abdominal pain today, which is constant, sharp, 10 out of 10 with radiation to back. He also complains of nausea but no vomiting or diarrhea. He denies any dysuria or hematuria. CAT scan of the abdomen show acute pancreatitis. He Admitted drinking  one can of beer on Christmas and denied drinking alcohol regularly . The patient felt somewhat better today, initiated on low-fat, low-cholesterol diet, however, developed severe abdominal pain and his diet was downgraded. Lipase level was normal in the morning.   PAST MEDICAL HISTORY:       Past Medical History:  Diagnosis Date  . Diabetes mellitus without complication (HCC)   . Morbid obesity (HCC)   . Thyroid nodule   . Thyrotoxicosis without thyroid storm     PAST SURGICAL HISTORY:        Past Surgical History:  Procedure Laterality Date  . ABDOMINAL SURGERY    . APPENDECTOMY    . HERNIA REPAIR      SOCIAL HISTORY:          Social History   Substance Use Topics   . Smoking status: Never Smoker   . Smokeless tobacco: Never Used   . Alcohol use Yes     Comment: "rarely"     FAMILY HISTORY:        Family History  Problem Relation Age of Onset  . Diabetes Mother   . Hypertension Mother   . Diabetes Father   . Heart failure Father   . Kidney disease Father     DRUG ALLERGIES:       Allergies  Allergen Reactions  . Iodine Anaphylaxis    REVIEW OF SYSTEMS:   Review of  Systems  Constitutional: Positive for malaise/fatigue. Negative for chills and fever.  HENT: Negative for congestion.   Eyes: Negative for blurred vision and double vision.  Respiratory: Negative for cough, shortness of breath and stridor.   Cardiovascular: Negative for chest pain and leg swelling.  Gastrointestinal: Positive for abdominal pain and nausea. Negative for blood in stool, constipation, diarrhea, melena and vomiting.  Genitourinary: Negative for dysuria and hematuria.  Musculoskeletal: Positive for back pain.  Neurological: Negative for dizziness, focal weakness, loss of consciousness and weakness.  Psychiatric/Behavioral: Negative for depression. The patient is not nervous/anxious.     MEDICATIONS AT HOME:          Prior to Admission medications   Medication Sig Start Date End Date Taking? Authorizing Provider  aspirin EC 81 MG tablet Take 81 mg by mouth daily.   Yes Historical Provider, MD  ibuprofen (ADVIL,MOTRIN) 200 MG tablet Take 200 mg by mouth every 6 (six) hours as needed for fever, headache, mild pain, moderate pain or cramping.    Yes Historical Provider, MD  metFORMIN (GLUCOPHAGE-XR) 750 MG 24 hr tablet Take 750 mg by mouth daily with supper.    Historical Provider, MD  ondansetron (ZOFRAN) 4 MG tablet Take 1 tablet (4 mg total) by  mouth daily as needed for nausea or vomiting. Patient not taking: Reported on 10/09/2016 07/25/15   Jene Every, MD      VITAL SIGNS:  Blood pressure 132/76, pulse 95, temperature 99.3 F (37.4 C), temperature source Oral, resp. rate 18, height 5\' 11"  (1.803 m), weight 129.5 kg (285 lb 6.4 oz), SpO2 98 %.  PHYSICAL EXAMINATION:  Physical Exam  GENERAL:  45 y.o.-year-old patient lying in the bed withIn the mild to moderate distress due to abdominal distention and pain. Morbidly obese. EYES: Pupils equal, round, reactive to light and accommodation. No scleral icterus. Extraocular muscles intact.  HEENT: Head  atraumatic, normocephalic. Oropharynx and nasopharynx clear.  NECK:  Supple, no jugular venous distention. No thyroid enlargement, no tenderness.  LUNGS: Normal breath sounds bilaterally, no wheezing, rales,rhonchi or crepitation. No use of accessory muscles of respiration.  CARDIOVASCULAR: S1, S2 normal. No murmurs, rubs, or gallops.  ABDOMEN: Soft, tenderness in The middle and epigastric area, mild distention, no rebound or guarding. Bowel sounds present, diminished. No organomegaly or mass.  EXTREMITIES: No pedal edema, cyanosis, or clubbing.  NEUROLOGIC: Cranial nerves II through XII are intact. Muscle strength 5/5 in all extremities. Sensation intact. Gait not checked.  PSYCHIATRIC: The patient is alert and oriented x 3.  SKIN: No obvious rash, lesion, or ulcer.   LABORATORY PANEL:   CBC  Last Labs    Recent Labs Lab 10/10/16 0451  WBC 18.3*  HGB 12.3*  HCT 36.9*  PLT 207     ------------------------------------------------------------------------------------------------------------------  Chemistries   Last Labs    Recent Labs Lab 10/09/16 0434 10/10/16 0451  NA 133* 135  K 3.6 4.0  CL 102 106  CO2 24 25  GLUCOSE 152* 126*  BUN 13 13  CREATININE 1.10 1.13  CALCIUM 8.8* 8.4*  AST 24  --   ALT 23  --   ALKPHOS 78  --   BILITOT 0.6  --      ------------------------------------------------------------------------------------------------------------------  Cardiac Enzymes Last Labs   No results for input(s): TROPONINI in the last 168 hours.   ------------------------------------------------------------------------------------------------------------------  RADIOLOGY:   Imaging Results (Last 48 hours)  Ct Renal Stone Study  Result Date: 10/09/2016 CLINICAL DATA:  Left flank pain and nausea. EXAM: CT ABDOMEN AND PELVIS WITHOUT CONTRAST TECHNIQUE: Multidetector CT imaging of the abdomen and pelvis was performed following the standard protocol  without IV contrast. COMPARISON:  CT 06/10/2009 FINDINGS: Lower chest: Mild upper and atelectasis.  No pleural fluid. Hepatobiliary: Hepatomegaly. Hepatic steatosis with focal fatty sparing adjacent to the gallbladder fossa. Question intraluminal sludge, no calcified stone. No biliary dilatation. Pancreas: Peripancreatic edema and soft tissue stranding about the distal body and tail. No ductal dilatation. No peripancreatic fluid collection. Cannot assess for enhancement given lack of contrast. Spleen: Upper normal in size. No focal abnormality on noncontrast exam. Adrenals/Urinary Tract: No adrenal nodule. No hydronephrosis or perinephric edema. No urolithiasis. Both ureters are decompressed. Urinary bladder is minimally distended. No bladder stone. Stomach/Bowel: The stomach is decompressed. No small bowel dilatation or inflammation. Small volume of colonic stool. Appendix not confidently identified, no pericecal inflammation. Vascular/Lymphatic: No significant vascular findings are present. No enlarged abdominal or pelvic lymph nodes. Reproductive: Prostate is unremarkable. Other: No ascites or free air. Minimal fat in the left greater than right inguinal canal. Musculoskeletal: There are no acute or suspicious osseous abnormalities. Scattered degenerative change in the spine. IMPRESSION: 1. Acute edematous pancreatitis. 2.  No renal stones or obstructive uropathy. 3. Hepatic steatosis. Electronically Signed  By: Rubye OaksMelanie  Ehinger M.D.   On: 10/09/2016 05:56   [place] Abdomen Limited Ruq  Result Date: 10/09/2016 CLINICAL DATA:  Pancreatitis. EXAM: US ABDOMEN LIMITED - RIGHT UPPER QUADRANT COMPARISON:  CT 10/09/2016. FINDINGS: Gallbladder: No gallstones or wall thickening visualized. No sonographic Murphy sign noted by sonographer. Common bile duct: Diameter: 3.3 mm Liver: There is echogenic consistent fatty infiltration and/or hepatocellular disease. IMPRESSION: 1. Liver is echogenic consistent fatty  infiltration and/or hepatocellular disease. 2. No acute or focal abnormality. Electronically Signed   By: Maisie Fushomas  Register   On: 10/09/2016 07:52       IMPRESSION AND PLAN:   #1 Acute pancreatitis.Continue IV fluids, initiate clear liquid diet, follow lipase level in the morning, get gastroenterologist involved, get KUB to rule out ileus, continue PPI    #2 Leukocytosis. Possible  reaction to pancreatitis. Follow-up CBC In the morning.  #3 Hyponatremia, Resolved on saline solution, follow-up BMP.  #4 Diabetes. Hold metformin and. Continue sliding  scale Insulin therapy, blood glucose levels are between 102-140 #5. Alcohol abuse, watch for withdrawal.   All the records are reviewed and case discussed with ED provider. Management plans discussed with the patient, His wife and they are in agreement.  CODE STATUS: Full code  TOTAL TIME TAKING CARE OF THIS PATIENT:30 minutes.    Katharina CaperVAICKUTE,Romeo Zielinski M.D on 10/10/2016 at 4:01 PM  Between 7am to 6pm - Pager - [phone]  After 6pm go to [email or web address] - Social research officer, governmentpassword [email or web address] [email or web address]  [facility] [facility]  Office  [phone]  CC: Primary care physician; [facility]   Note: This dictation was prepared with Dragon dictation along with smaller phrase technology. Any transcriptional errors that result from this process are unintentional.

## 2016-10-10 NOTE — Plan of Care (Signed)
Problem: Health Behavior/Discharge Planning: Goal: Ability to formulate a plan to maintain an alcohol-free life will improve Outcome: Not Applicable Date Met: 10/10/16 Pt reports he does not drink alcohol except on rare occassions  Problem: Nutritional: Goal: Ability to achieve adequate nutritional intake will improve Outcome: Not Applicable Date Met: 10/10/16 NPO at this time   

## 2016-10-11 DIAGNOSIS — K859 Acute pancreatitis without necrosis or infection, unspecified: Principal | ICD-10-CM

## 2016-10-11 LAB — CBC
HEMATOCRIT: 34.4 % — AB (ref 40.0–52.0)
Hemoglobin: 11.7 g/dL — ABNORMAL LOW (ref 13.0–18.0)
MCH: 29.2 pg (ref 26.0–34.0)
MCHC: 34.1 g/dL (ref 32.0–36.0)
MCV: 85.8 fL (ref 80.0–100.0)
PLATELETS: 196 10*3/uL (ref 150–440)
RBC: 4.01 MIL/uL — ABNORMAL LOW (ref 4.40–5.90)
RDW: 13.1 % (ref 11.5–14.5)
WBC: 14.9 10*3/uL — ABNORMAL HIGH (ref 3.8–10.6)

## 2016-10-11 LAB — GLUCOSE, CAPILLARY
GLUCOSE-CAPILLARY: 136 mg/dL — AB (ref 65–99)
Glucose-Capillary: 91 mg/dL (ref 65–99)

## 2016-10-11 LAB — LIPASE, BLOOD: Lipase: 16 U/L (ref 11–51)

## 2016-10-11 LAB — HEMOGLOBIN A1C
Hgb A1c MFr Bld: 5.9 % — ABNORMAL HIGH (ref 4.8–5.6)
MEAN PLASMA GLUCOSE: 123 mg/dL

## 2016-10-11 MED ORDER — POLYETHYLENE GLYCOL 3350 17 G PO PACK: 17.0000 g | PACK | Freq: Every day | ORAL | 0 refills | Status: AC

## 2016-10-11 MED ORDER — FAMOTIDINE 20 MG PO TABS: 20.0000 mg | ORAL_TABLET | Freq: Two times a day (BID) | ORAL | Status: DC

## 2016-10-11 MED ORDER — METHYLPREDNISOLONE SODIUM SUCC 125 MG IJ SOLR
60.0000 mg | Freq: Once | INTRAMUSCULAR | Status: AC
  Administered 2016-10-11: 09:00:00 60 mg via INTRAVENOUS
  Filled 2016-10-11: qty 2

## 2016-10-11 MED ORDER — SUCRALFATE 1 G PO TABS: 1.0000 g | ORAL_TABLET | Freq: Three times a day (TID) | ORAL | 5 refills | Status: DC

## 2016-10-11 MED ORDER — DIPHENHYDRAMINE HCL 25 MG PO CAPS: 25.0000 mg | ORAL_CAPSULE | Freq: Four times a day (QID) | ORAL | Status: DC | PRN

## 2016-10-11 MED ORDER — FAMOTIDINE IN NACL 20-0.9 MG/50ML-% IV SOLN
20.0000 mg | Freq: Two times a day (BID) | INTRAVENOUS | Status: DC
  Administered 2016-10-11: 20 mg via INTRAVENOUS
  Filled 2016-10-11 (×2): qty 50

## 2016-10-11 MED ORDER — DIPHENHYDRAMINE HCL 25 MG PO CAPS: 25.0000 mg | ORAL_CAPSULE | Freq: Four times a day (QID) | ORAL | 0 refills | Status: AC | PRN

## 2016-10-11 MED ORDER — POLYETHYLENE GLYCOL 3350 17 G PO PACK
17.0000 g | PACK | Freq: Every day | ORAL | Status: DC
  Administered 2016-10-11: 09:00:00 17 g via ORAL
  Filled 2016-10-11: qty 1

## 2016-10-11 MED ORDER — SODIUM CHLORIDE 0.9 % IV SOLN
INTRAVENOUS | Status: DC
  Administered 2016-10-11: 09:00:00 via INTRAVENOUS

## 2016-10-11 MED ORDER — PANTOPRAZOLE SODIUM 40 MG PO TBEC: 40.0000 mg | DELAYED_RELEASE_TABLET | Freq: Two times a day (BID) | ORAL | 5 refills | Status: DC

## 2016-10-11 NOTE — Discharge Summary (Signed)
[facility] [place] at [facility]   PATIENT NAME: Diana EvesDaniel Thain    MR#:  [id number]  DATE OF BIRTH:  [birth year]  DATE OF ADMISSION:  10/09/2016 ADMITTING PHYSICIAN: Shaune PollackQing Chen, MD  DATE OF DISCHARGE: 10/11/2016  3:31 PM  PRIMARY CARE PHYSICIAN: [facility]     ADMISSION DIAGNOSIS:  Pancreatitis [K85.90] Acute pancreatitis, unspecified complication status, unspecified pancreatitis type [K85.90]  DISCHARGE DIAGNOSIS:  Principal Problem:   Acute pancreatitis Active Problems:   Leukocytosis   Hyponatremia   Alcohol abuse   Diabetes (HCC)   SECONDARY DIAGNOSIS:   Past Medical History:  Diagnosis Date  . Diabetes mellitus without complication (HCC)   . Morbid obesity (HCC)   . Thyroid nodule   . Thyrotoxicosis without thyroid storm     .pro HOSPITAL COURSE:  DanielGravesis a 45 y.o.malewith a known history of Diabetes and morbid obesity. The patient presents to the ED with abdominal pain today, which is constant, sharp, 10 out of 10 with radiation to back. He also complains of nausea but no vomiting or diarrhea. He denies any dysuria or hematuria. CAT scan of the abdomen show acute pancreatitis. He admitted drinking one can of beer on Christmas and denied drinking alcohol regularly . The patient Improved with conservative therapy and was felt to be stable to be discharged home. He was seen by gastroenterologist and recommended to continue PPI, follow up with him as outpatient for further workup including IgG 4 levels for possible altered mental pancreatitis, ANA, possible endoscopic ultrasound. Gastroenterology is also recommended to continue MiraLAX to help with constipation. Discussion by problem: #1 Acute pancreatitis, lipase normalized, patient was able to tolerate full liquid diet, the patient was seen by  gastroenterologist, who recommended to follow-up with him as outpatient for further workup of intermittent pancreatitis,  continue PPI , Carafate, and MiraLAX. KUB showed no ileus #2 Leukocytosis. Possible reaction to pancreatitis.Improve this conservative therapy, follow CBC as outpatient #3 Hyponatremia, Resolved on intravenous salinesolution. #4 Diabetes, resume outpatient medications #5. Alcohol abuse. Patient had no withdrawal symptoms.  . Team:  [name] Wohl, MD  DRUG ALLERGIES:   Allergies  Allergen Reactions  . Iodine Anaphylaxis    DISCHARGE MEDICATIONS:   Discharge Medication List as of 10/11/2016  3:16 PM    START taking these medications   Details  diphenhydrAMINE (BENADRYL) 25 mg capsule Take 1 capsule (25 mg total) by mouth every 6 (six) hours as needed for itching or allergies., Starting Fri 10/11/2016, Normal    !! ondansetron (ZOFRAN) 4 MG tablet Take 1 tablet (4 mg total) by mouth every 6 (six) hours as needed for nausea., Starting Thu 10/10/2016, Normal    pantoprazole (PROTONIX) 40 MG tablet Take 1 tablet (40 mg total) by mouth 2 (two) times daily., Starting Fri 10/11/2016, Normal    polyethylene glycol (MIRALAX / GLYCOLAX) packet Take 17 g by mouth daily., Starting Sat 10/12/2016, Normal    senna-docusate (SENOKOT-S) 8.6-50 MG tablet Take 1 tablet by mouth at bedtime as needed for mild constipation., Starting Thu 10/10/2016, Normal    sucralfate (CARAFATE) 1 g tablet Take 1 tablet (1 g total) by mouth 4 (four) times daily -  with meals and at bedtime., Starting Fri 10/11/2016, Normal     !! - Potential duplicate medications found. Please discuss with provider.    CONTINUE these medications which have NOT CHANGED   Details  aspirin EC 81 MG tablet Take 81 mg by mouth daily., Historical Med  metFORMIN (GLUCOPHAGE-XR) 750 MG 24 hr tablet Take 750 mg by mouth daily with supper., Historical Med    lovastatin (MEVACOR) 10 MG tablet Take 1 tablet by mouth daily., Starting Tue 08/06/2016, Historical Med    !! ondansetron (ZOFRAN) 4 MG tablet Take 1 tablet (4 mg total) by  mouth daily as needed for nausea or vomiting., Starting Tue 07/25/2015, Print     !! - Potential duplicate medications found. Please discuss with provider.    STOP taking these medications     ibuprofen (ADVIL,MOTRIN) 200 MG tablet      HYDROcodone-acetaminophen (NORCO/VICODIN) 5-325 MG tablet          DISCHARGE INSTRUCTIONS:     Patient is to follow-up with primary care physician, gastroenterologist as outpatient  If you experience worsening of your admission symptoms, develop shortness of breath, life threatening emergency, suicidal or homicidal thoughts you must seek medical attention immediately by calling 911 or calling your MD immediately  if symptoms less severe.  You Must read complete instructions/literature along with all the possible adverse reactions/side effects for all the Medicines you take and that have been prescribed to you. Take any new Medicines after you have completely understood and accept all the possible adverse reactions/side effects.   Please note  You were cared for by a hospitalist during your hospital stay. If you have any questions about your discharge medications or the care you received while you were in the hospital after you are discharged, you can call the unit and asked to speak with the hospitalist on call if the hospitalist that took care of you is not available. Once you are discharged, your primary care physician will handle any further medical issues. Please note that NO REFILLS for any discharge medications will be authorized once you are discharged, as it is imperative that you return to your primary care physician (or establish a relationship with a primary care physician if you do not have one) for your aftercare needs so that they can reassess your need for medications and monitor your lab values.    Today   CHIEF COMPLAINT:   Chief Complaint  Patient presents with  . Flank Pain    HISTORY OF PRESENT ILLNESS:  Gryffin Altice  is a  45 y.o. male with a known history of Diabetes and morbid obesity. The patient presents to the ED with abdominal pain today, which is constant, sharp, 10 out of 10 with radiation to back. He also complains of nausea but no vomiting or diarrhea. He denies any dysuria or hematuria. CAT scan of the abdomen show acute pancreatitis. He admitted drinking one can of beer on Christmas and denied drinking alcohol regularly . The patient Improved with conservative therapy and was felt to be stable to be discharged home. He was seen by gastroenterologist and recommended to continue PPI, follow up with him as outpatient for further workup including IgG 4 levels for possible altered mental pancreatitis, ANA, possible endoscopic ultrasound. Gastroenterology is also recommended to continue MiraLAX to help with constipation. Discussion by problem: #1 Acute pancreatitis, lipase normalized, patient was able to tolerate full liquid diet, the patient was seen by  gastroenterologist, who recommended to follow-up with him as outpatient for further workup of intermittent pancreatitis, continue PPI , Carafate, and MiraLAX. KUB showed no ileus #2 Leukocytosis. Possible reaction to pancreatitis.Improve this conservative therapy, follow CBC as outpatient #3 Hyponatremia, Resolved on intravenous salinesolution. #4 Diabetes, resume outpatient medications #5. Alcohol abuse. Patient had no withdrawal symptoms.  VITAL SIGNS:  Blood pressure 139/84, pulse 85, temperature 98.6 F (37 C), temperature source Oral, resp. rate 20, height 5\' 11"  (1.803 m), weight 129.5 kg (285 lb 6.4 oz), SpO2 98 %.  I/O:   Intake/Output Summary (Last 24 hours) at 10/11/16 1620 Last data filed at 10/11/16 0900  Gross per 24 hour  Intake          1976.25 ml  Output              500 ml  Net          1476.25 ml    PHYSICAL EXAMINATION:  GENERAL:  45 y.o.-year-old patient lying in the bed with no acute distress.  EYES: Pupils equal, round,  reactive to light and accommodation. No scleral icterus. Extraocular muscles intact.  HEENT: Head atraumatic, normocephalic. Oropharynx and nasopharynx clear.  NECK:  Supple, no jugular venous distention. No thyroid enlargement, no tenderness.  LUNGS: Normal breath sounds bilaterally, no wheezing, rales,rhonchi or crepitation. No use of accessory muscles of respiration.  CARDIOVASCULAR: S1, S2 normal. No murmurs, rubs, or gallops.  ABDOMEN: Soft, non-tender, non-distended. Bowel sounds present. No organomegaly or mass.  EXTREMITIES: No pedal edema, cyanosis, or clubbing.  NEUROLOGIC: Cranial nerves II through XII are intact. Muscle strength 5/5 in all extremities. Sensation intact. Gait not checked.  PSYCHIATRIC: The patient is alert and oriented x 3.  SKIN: No obvious rash, lesion, or ulcer.   DATA REVIEW:   CBC  Recent Labs Lab 10/11/16 0733  WBC 14.9*  HGB 11.7*  HCT 34.4*  PLT 196    Chemistries   Recent Labs Lab 10/09/16 0434 10/10/16 0451  NA 133* 135  K 3.6 4.0  CL 102 106  CO2 24 25  GLUCOSE 152* 126*  BUN 13 13  CREATININE 1.10 1.13  CALCIUM 8.8* 8.4*  AST 24  --   ALT 23  --   ALKPHOS 78  --   BILITOT 0.6  --     Cardiac Enzymes No results for input(s): TROPONINI in the last 168 hours.  Microbiology Results  No results found for this or any previous visit.  RADIOLOGY:  Dg Abd 1 View  Result Date: 10/10/2016 CLINICAL DATA:  Abdominal pain EXAM: ABDOMEN - 1 VIEW COMPARISON:  CT 10/09/2016 FINDINGS: The bowel gas pattern is normal. No radio-opaque calculi or other significant radiographic abnormality are seen. IMPRESSION: Negative. Electronically Signed   By: Charlett NoseKevin  Dover M.D.   On: 10/10/2016 16:45    EKG:   Orders placed or performed during the hospital encounter of 07/25/15  . ED EKG  . ED EKG  . EKG 12-Lead  . EKG 12-Lead  . EKG      Management plans discussed with the patient, family and they are in agreement.  CODE STATUS:      Code Status Orders        Start     Ordered   10/09/16 0903  Full code  Continuous     10/09/16 0902    Code Status History    Date Active Date Inactive Code Status Order ID Comments User Context   This patient has a current code status but no historical code status.      TOTAL TIME TAKING CARE OF THIS PATIENT: 40 minutes.    Katharina CaperVAICKUTE,Carli Lefevers M.D on 10/11/2016 at 4:20 PM  Between 7am to 6pm - Pager - [phone]  After 6pm go to [email or web address] - Scientist, research (life sciences)password EPAS [facility]  [facility]  Office  [phone]  CC: Primary care physician; [facility]

## 2016-10-11 NOTE — Progress Notes (Signed)
Patient d/ced home.  Will follow up with GI outpatient.  Lipase is 16.  Patient's pain is much improved since yesterday.  He has had no N&V.  Will go home on protonix and carafate.  WBC count also down from 18.3 to 14.9.  Patient received a 1x dose of solumedrol for possible stomach inflammation and has been advised to be careful about taking aspirin products.  IV removed.  D/c instructions and f/u appts reviewed.  Patient going home with daughters.

## 2016-10-11 NOTE — Consult Note (Signed)
Gary Miniumarren Ceil Roderick, MD [facility]  [address] [place], [place] [address] Phone: [phone] Fax : [phone]  Consultation  Referring Provider:     Dr. Winona LegatoVaickute Primary Care Physician:  [facility] Primary Gastroenterologist:  None         Reason for Consultation:     Pancreatitis  Date of Admission:  10/09/2016 Date of Consultation:  10/11/2016         HPI:   Gary EvesDaniel Holloway is a 45 y.o. male was admitted with abdominal pain. The patient reports that he does not drink alcohol and only had one beer on Christmas. The patient states that his abdominal pain was in the epigastric area. The patient underwent a CT scan that showed pancreatitis despite his pancreatic enzymes being 16 today and in the 30s yesterday. The patient reports that he had a similar episode a year ago but didn't have a primary care physician nor did he follow up with a gastrologist. I was called to see the patient today for his abdominal pain and pancreatitis. The patient states that his abdominal pain is much better today than it was yesterday. His biggest concern right now is that because of his pain medication is not moving his bowels. The patient has had MiraLAX and is also having enemas with very little relief of his constipation. Patient has also been told that he is going home today.  Past Medical History:  Diagnosis Date  . Diabetes mellitus without complication (HCC)   . Morbid obesity (HCC)   . Thyroid nodule   . Thyrotoxicosis without thyroid storm     Past Surgical History:  Procedure Laterality Date  . ABDOMINAL SURGERY    . APPENDECTOMY    . HERNIA REPAIR      Prior to Admission medications   Medication Sig Start Date End Date Taking? Authorizing Provider  aspirin EC 81 MG tablet Take 81 mg by mouth daily.   Yes Historical Provider, MD  ibuprofen (ADVIL,MOTRIN) 200 MG tablet Take 200 mg by mouth every 6 (six) hours as needed for fever, headache, mild pain, moderate pain or  cramping.    Yes Historical Provider, MD  metFORMIN (GLUCOPHAGE-XR) 750 MG 24 hr tablet Take 750 mg by mouth daily with supper.   Yes Historical Provider, MD  diphenhydrAMINE (BENADRYL) 25 mg capsule Take 1 capsule (25 mg total) by mouth every 6 (six) hours as needed for itching or allergies. 10/11/16   Katharina Caperima Vaickute, MD  lovastatin (MEVACOR) 10 MG tablet Take 1 tablet by mouth daily. 08/06/16   Historical Provider, MD  ondansetron (ZOFRAN) 4 MG tablet Take 1 tablet (4 mg total) by mouth daily as needed for nausea or vomiting. Patient not taking: Reported on 10/09/2016 07/25/15   Jene Everyobert Kinner, MD  ondansetron (ZOFRAN) 4 MG tablet Take 1 tablet (4 mg total) by mouth every 6 (six) hours as needed for nausea. 10/10/16   Katharina Caperima Vaickute, MD  pantoprazole (PROTONIX) 40 MG tablet Take 1 tablet (40 mg total) by mouth 2 (two) times daily. 10/11/16   Katharina Caperima Vaickute, MD  polyethylene glycol (MIRALAX / GLYCOLAX) packet Take 17 g by mouth daily. 10/12/16   Katharina Caperima Vaickute, MD  senna-docusate (SENOKOT-S) 8.6-50 MG tablet Take 1 tablet by mouth at bedtime as needed for mild constipation. 10/10/16   Katharina Caperima Vaickute, MD  sucralfate (CARAFATE) 1 g tablet Take 1 tablet (1 g total) by mouth 4 (four) times daily -  with meals and at bedtime. 10/11/16   Katharina Caperima Vaickute, MD  Family History  Problem Relation Age of Onset  . Diabetes Mother   . Hypertension Mother   . Diabetes Father   . Heart failure Father   . Kidney disease Father      Social History  Substance Use Topics  . Smoking status: Never Smoker  . Smokeless tobacco: Never Used  . Alcohol use Yes     Comment: "rarely"    Allergies as of 10/09/2016 - Review Complete 10/09/2016  Allergen Reaction Noted  . Iodine Anaphylaxis 07/25/2015    Review of Systems:    All systems reviewed and negative except where noted in HPI.   Physical Exam:  Vital signs in last 24 hours: Temp:  [99.1 F (37.3 C)-99.9 F (37.7 C)] 99.9 F (37.7 C) (12/29 0559) Pulse  Rate:  [88-95] 90 (12/29 0559) Resp:  [18-22] 22 (12/29 0559) BP: (118-134)/(76-79) 134/79 (12/29 0559) SpO2:  [96 %-99 %] 99 % (12/29 0559) Last BM Date: 10/10/16 General:   Pleasant, cooperative in NAD Head:  Normocephalic and atraumatic. Eyes:   No icterus.   Conjunctiva pink. PERRLA. Ears:  Normal auditory acuity. Neck:  Supple; no masses or thyroidomegaly Lungs: Respirations even and unlabored. Lungs clear to auscultation bilaterally.   No wheezes, crackles, or rhonchi.  Heart:  Regular rate and rhythm;  Without murmur, clicks, rubs or gallops Abdomen:  Soft, Epigastric tenderness to palpation, nontender. Normal bowel sounds. No appreciable masses or hepatomegaly.  No rebound or guarding.  Rectal:  Not performed. Msk:  Symmetrical without gross deformities.    Extremities:  Without edema, cyanosis or clubbing. Neurologic:  Alert and oriented x3;  grossly normal neurologically. Skin:  Intact without significant lesions or rashes. Cervical Nodes:  No significant cervical adenopathy. Psych:  Alert and cooperative. Normal affect.  LAB RESULTS:  Recent Labs  10/09/16 0434 10/10/16 0451 10/11/16 0733  WBC 15.5* 18.3* 14.9*  HGB 13.6 12.3* 11.7*  HCT 40.2 36.9* 34.4*  PLT 244 207 196   BMET  Recent Labs  10/09/16 0434 10/10/16 0451  NA 133* 135  K 3.6 4.0  CL 102 106  CO2 24 25  GLUCOSE 152* 126*  BUN 13 13  CREATININE 1.10 1.13  CALCIUM 8.8* 8.4*   LFT  Recent Labs  10/09/16 0434  PROT 7.9  ALBUMIN 4.1  AST 24  ALT 23  ALKPHOS 78  BILITOT 0.6   PT/INR No results for input(s): LABPROT, INR in the last 72 hours.  STUDIES: Dg Abd 1 View  Result Date: 10/10/2016 CLINICAL DATA:  Abdominal pain EXAM: ABDOMEN - 1 VIEW COMPARISON:  CT 10/09/2016 FINDINGS: The bowel gas pattern is normal. No radio-opaque calculi or other significant radiographic abnormality are seen. IMPRESSION: Negative. Electronically Signed   By: Charlett Nose M.D.   On: 10/10/2016 16:45       Impression / Plan:   Gary Holloway is a 45 y.o. y/o male with normal lipase but a CT scan showing pancreatitis with inflammation surrounding the pancreas and involving the pancreas. The patient also has abdominal pain consistent with pancreatitis. The patient needs a further workup including IgG4 levels for possible autoimmune pancreatitis, ANA and possibly an EUS. Since the patient has been slated to go home today the patient has been given my business card and will follow up as an outpatient for complete workup of his pancreatitis and abdominal pain. The patient should be sent home with a PPI and MiraLAX to help with his constipation. The patient has been explained the plan and  agrees with it.  Thank you for involving me in the care of this patient.      LOS: 2 days   Gary Miniumarren Ambra Haverstick, MD  10/11/2016, 11:03 AM   Note: This dictation was prepared with Dragon dictation along with smaller phrase technology. Any transcriptional errors that result from this process are unintentional.

## 2016-10-11 NOTE — Progress Notes (Signed)
PHARMACIST - PHYSICIAN COMMUNICATION  DR:   Winona LegatoVaickute  CONCERNING: IV to Oral Route Change Policy  RECOMMENDATION: This patient is receiving famotidine by the intravenous route.  Based on criteria approved by the Pharmacy and Therapeutics Committee, the intravenous medication(s) is/are being converted to the equivalent oral dose form(s).   DESCRIPTION: These criteria include:  The patient is eating (either orally or via tube) and/or has been taking other orally administered medications for a least 24 hours  The patient has no evidence of active gastrointestinal bleeding or impaired GI absorption (gastrectomy, short bowel, patient on TNA or NPO).  If you have questions about this conversion, please contact the Pharmacy Department  []   [phone]( [phone] )  [facility] [x]   [phone]( [phone] )  [facility] []   [phone]( [phone] )  [facility] []   [phone]( [phone] )  [facility] []   [phone]( [phone] )  [facility]   Olene FlossMelissa D Lura Falor, Vermont[place]arm.D, BCPS Clinical Pharmacist  10/11/2016 9:31 AM

## 2016-10-11 NOTE — Progress Notes (Signed)
            To Whom It May Concern:       [facility] through 10/11/2016. He is to return back to work in full capacity on 10/17/2016. Thank you for your understanding.       Sincerely,  Katharina Caperima Allisa Einspahr, MD

## 2016-10-28 ENCOUNTER — Ambulatory Visit: Payer: Self-pay | Admitting: Gastroenterology

## 2016-11-07 ENCOUNTER — Encounter: Payer: Self-pay | Admitting: Gastroenterology

## 2016-11-07 ENCOUNTER — Ambulatory Visit (INDEPENDENT_AMBULATORY_CARE_PROVIDER_SITE_OTHER): Payer: 59 | Admitting: Gastroenterology

## 2016-11-07 ENCOUNTER — Other Ambulatory Visit: Payer: Self-pay

## 2016-11-07 VITALS — BP 139/74 | HR 83 | Temp 98.0°F | Ht 71.0 in | Wt 275.0 lb

## 2016-11-07 DIAGNOSIS — K859 Acute pancreatitis without necrosis or infection, unspecified: Secondary | ICD-10-CM | POA: Diagnosis not present

## 2016-11-07 NOTE — Progress Notes (Signed)
[facility]  Primary Gastroenterologist:  Dr. [name] Orlanda Holloway  No chief complaint on file.   HPI: Gary EvesDaniel Holloway is a 46 y.o. male here for follow-up after being in the hospital with pancreatitis. The patient was found to have lipase of 16 prior to discharge and was only in the 30s when he was admitted. The patient's abdominal pain was epigastric and he had a CT scan consistent with pancreatitis. The patient also had a history of significant weight loss of over 100 pounds in the past but he states he has gained it back. There is no report of any abdominal pain at the present time. He also denies any black stools or bloody stools. The patient does have diabetes and he reports a similar episode of pancreatitis a year ago.  Current Outpatient Prescriptions  Medication Sig Dispense Refill  . aspirin EC 81 MG tablet Take 81 mg by mouth daily.    . diphenhydrAMINE (BENADRYL) 25 mg capsule Take 1 capsule (25 mg total) by mouth every 6 (six) hours as needed for itching or allergies. 30 capsule 0  . fluticasone (FLONASE) 50 MCG/ACT nasal spray Reported on 11/28/2015    . lovastatin (MEVACOR) 10 MG tablet Take 1 tablet by mouth daily.    . metFORMIN (GLUCOPHAGE-XR) 750 MG 24 hr tablet Take 750 mg by mouth daily with supper.    . pantoprazole (PROTONIX) 40 MG tablet Take 1 tablet (40 mg total) by mouth 2 (two) times daily. 60 tablet 5  . polyethylene glycol (MIRALAX / GLYCOLAX) packet Take 17 g by mouth daily. 14 each 0  . senna-docusate (SENOKOT-S) 8.6-50 MG tablet Take 1 tablet by mouth at bedtime as needed for mild constipation. 30 tablet 5  . sucralfate (CARAFATE) 1 g tablet Take 1 tablet (1 g total) by mouth 4 (four) times daily -  with meals and at bedtime. 90 tablet 5  . Clobetasol Propionate 0.05 % shampoo Apply topically.    Marland Kitchen. ketoconazole (NIZORAL) 2 % shampoo     . methimazole (TAPAZOLE) 10 MG tablet Take 1 tablet by mouth once daily    . ondansetron  (ZOFRAN) 4 MG tablet Take 1 tablet (4 mg total) by mouth daily as needed for nausea or vomiting. (Patient not taking: Reported on 10/09/2016) 20 tablet 1  . ondansetron (ZOFRAN) 4 MG tablet Take 1 tablet (4 mg total) by mouth every 6 (six) hours as needed for nausea. (Patient not taking: Reported on 11/07/2016) 20 tablet 0   No current facility-administered medications for this visit.     Allergies as of 11/07/2016 - Review Complete 11/07/2016  Allergen Reaction Noted  . Iodine Anaphylaxis 07/25/2015  . Contrast media  [iodinated diagnostic agents] Rash 11/17/2013  . Gadolinium Rash 07/14/2015    ROS:  General: Negative for anorexia, weight loss, fever, chills, fatigue, weakness. ENT: Negative for hoarseness, difficulty swallowing , nasal congestion. CV: Negative for chest pain, angina, palpitations, dyspnea on exertion, peripheral edema.  Respiratory: Negative for dyspnea at rest, dyspnea on exertion, cough, sputum, wheezing.  GI: See history of present illness. GU:  Negative for dysuria, hematuria, urinary incontinence, urinary frequency, nocturnal urination.  Endo: Negative for unusual weight change.    Physical Examination:   BP 139/74   Pulse 83   Temp 98 F (36.7 C) (Oral)   Ht 5\' 11"  (1.803 m)   Wt 275 lb (124.7 kg)   BMI 38.35 kg/m   General: Well-nourished, well-developed in no acute distress.  Eyes: No icterus. Conjunctivae pink. Mouth: Oropharyngeal mucosa moist and pink , no lesions erythema or exudate. Lungs: Clear to auscultation bilaterally. Non-labored. Heart: Regular rate and rhythm, no murmurs rubs or gallops.  Abdomen: Bowel sounds are normal, nontender, nondistended, no hepatosplenomegaly or masses, no abdominal bruits or hernia , no rebound or guarding.   Extremities: No lower extremity edema. No clubbing or deformities. Neuro: Alert and oriented x 3.  Grossly intact. Skin: Warm and dry, no jaundice.   Psych: Alert and cooperative, normal mood and  affect.  Labs:    Imaging Studies: Dg Abd 1 View  Result Date: 10/10/2016 CLINICAL DATA:  Abdominal pain EXAM: ABDOMEN - 1 VIEW COMPARISON:  CT 10/09/2016 FINDINGS: The bowel gas pattern is normal. No radio-opaque calculi or other significant radiographic abnormality are seen. IMPRESSION: Negative. Electronically Signed   By: Charlett Nose M.D.   On: 10/10/2016 16:45   Ct Renal Stone Study  Result Date: 10/09/2016 CLINICAL DATA:  Left flank pain and nausea. EXAM: CT ABDOMEN AND PELVIS WITHOUT CONTRAST TECHNIQUE: Multidetector CT imaging of the abdomen and pelvis was performed following the standard protocol without IV contrast. COMPARISON:  CT 06/10/2009 FINDINGS: Lower chest: Mild upper and atelectasis.  No pleural fluid. Hepatobiliary: Hepatomegaly. Hepatic steatosis with focal fatty sparing adjacent to the gallbladder fossa. Question intraluminal sludge, no calcified stone. No biliary dilatation. Pancreas: Peripancreatic edema and soft tissue stranding about the distal body and tail. No ductal dilatation. No peripancreatic fluid collection. Cannot assess for enhancement given lack of contrast. Spleen: Upper normal in size. No focal abnormality on noncontrast exam. Adrenals/Urinary Tract: No adrenal nodule. No hydronephrosis or perinephric edema. No urolithiasis. Both ureters are decompressed. Urinary bladder is minimally distended. No bladder stone. Stomach/Bowel: The stomach is decompressed. No small bowel dilatation or inflammation. Small volume of colonic stool. Appendix not confidently identified, no pericecal inflammation. Vascular/Lymphatic: No significant vascular findings are present. No enlarged abdominal or pelvic lymph nodes. Reproductive: Prostate is unremarkable. Other: No ascites or free air. Minimal fat in the left greater than right inguinal canal. Musculoskeletal: There are no acute or suspicious osseous abnormalities. Scattered degenerative change in the spine. IMPRESSION: 1. Acute  edematous pancreatitis. 2.  No renal stones or obstructive uropathy. 3. Hepatic steatosis. Electronically Signed   By: Rubye Oaks M.D.   On: 10/09/2016 05:56   US Abdomen Limited Ruq  Result Date: 10/09/2016 CLINICAL DATA:  Pancreatitis. EXAM: US ABDOMEN LIMITED - RIGHT UPPER QUADRANT COMPARISON:  CT 10/09/2016. FINDINGS: Gallbladder: No gallstones or wall thickening visualized. No sonographic Murphy sign noted by sonographer. Common bile duct: Diameter: 3.3 mm Liver: There is echogenic consistent fatty infiltration and/or hepatocellular disease. IMPRESSION: 1. Liver is echogenic consistent fatty infiltration and/or hepatocellular disease. 2. No acute or focal abnormality. Electronically Signed   By: Maisie Fus  Register   On: 10/09/2016 07:52    Assessment and Plan:   Eshaan Titzer is a 46 y.o. y/o male who comes in with his second episode of pancreatitis to the hospital and was discharged shortly after being seen by me. The patient will have his blood sent off for ANA and IgG4. The patient will also have his stool checked for fecal fat to make sure he does not need pancreatic replacement enzymes. If the patient has another bout of pancreatitis I would suggest considering a EUS. The patient has been explained the plan and agrees with it.    Gary Minium, MD. Clementeen Graham   Note: This dictation was prepared with Dragon dictation along  with smaller phrase technology. Any transcriptional errors that result from this process are unintentional.

## 2016-11-14 ENCOUNTER — Encounter: Payer: Self-pay | Admitting: Gastroenterology

## 2016-11-20 ENCOUNTER — Telehealth: Payer: Self-pay

## 2016-11-20 NOTE — Telephone Encounter (Signed)
LVM for pt to return my call.

## 2016-11-20 NOTE — Telephone Encounter (Signed)
Pt notified of lab results

## 2016-11-20 NOTE — Telephone Encounter (Signed)
-----   Message from [name] Wohl, MD sent at 11/15/2016 10:50 AM EST ----- The last note should be corrected to let the patient know that his fecal fat was normal and did not show any signs of him having a problem of digesting fat. Omit the word obstruction

## 2016-11-20 NOTE — Telephone Encounter (Signed)
-----   Message from Darren Wohl, MD sent at 11/15/2016 10:50 AM EST ----- The last note should be corrected to let the patient know that his fecal fat was normal and did not show any signs of him having a problem of digesting fat. Omit the word obstruction 

## 2018-08-05 IMAGING — CT CT RENAL STONE PROTOCOL
2 of 4 series · 16 of 46 positions shown, 18 images · non-contrast
Comparison: CT 06/10/2009

CLINICAL DATA: Left flank pain and nausea.

EXAM:
CT ABDOMEN AND PELVIS WITHOUT CONTRAST
TECHNIQUE: Multidetector CT imaging of the abdomen and pelvis was performed
following the standard protocol without IV contrast.

[Series 2: stone full standard · axial · 0.84mm/px · z∈[-774,-268]mm · 13 of 111 slices shown, 15 images]
[im 5/111  soft-tissue]
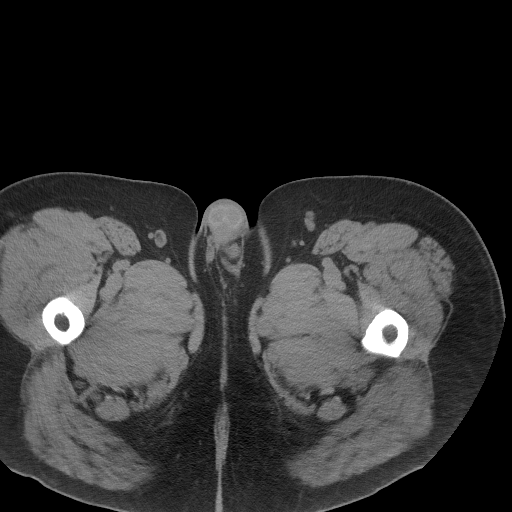
[im 5/111  bone]
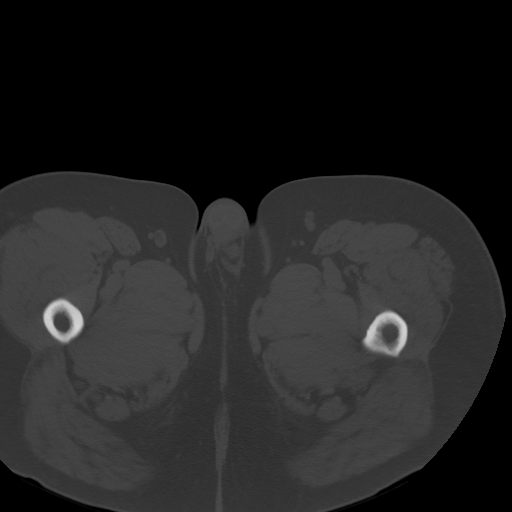
[im 14/111  soft-tissue]
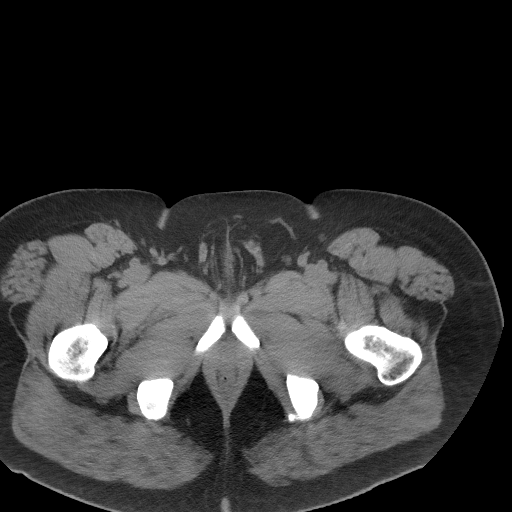
[im 23/111  soft-tissue]
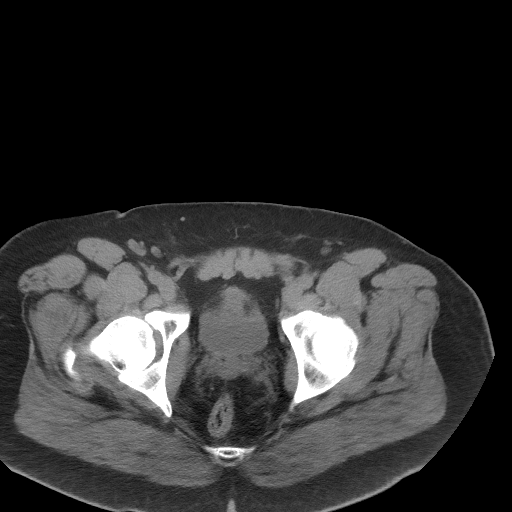
[im 31/111  soft-tissue]
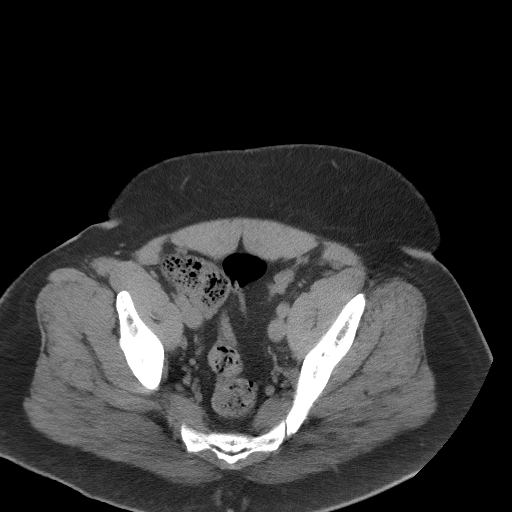
[im 40/111  soft-tissue]
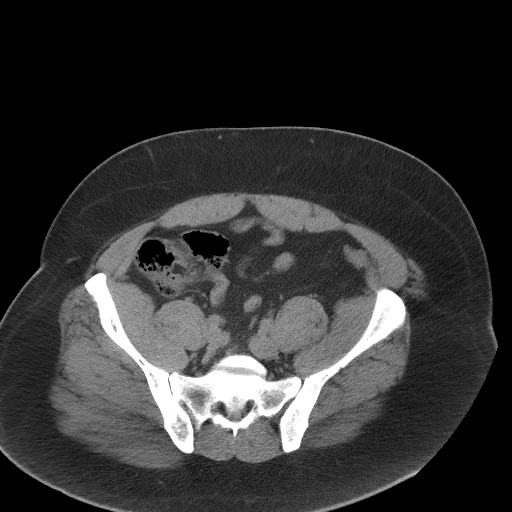
[im 49/111  soft-tissue]
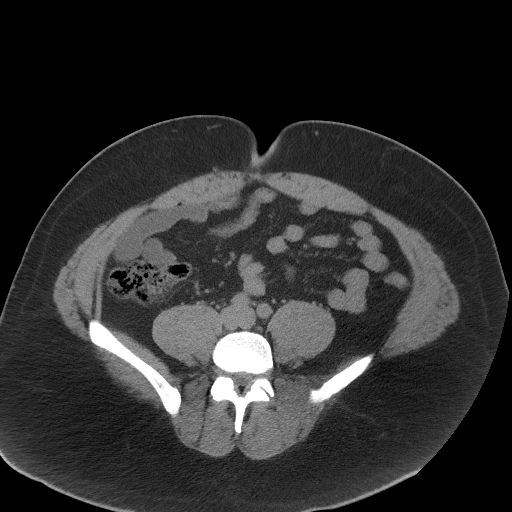
[im 58/111  soft-tissue]
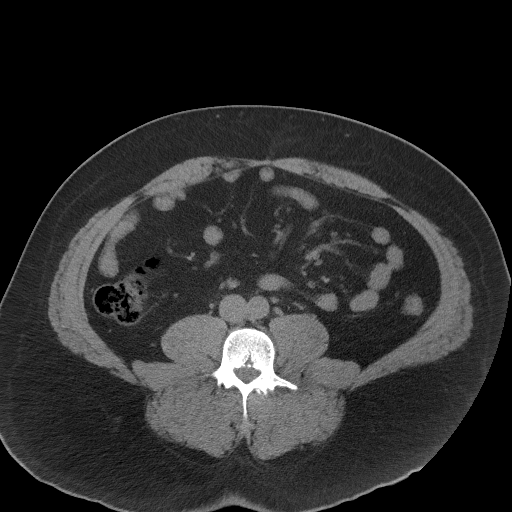
[im 62/111  soft-tissue]
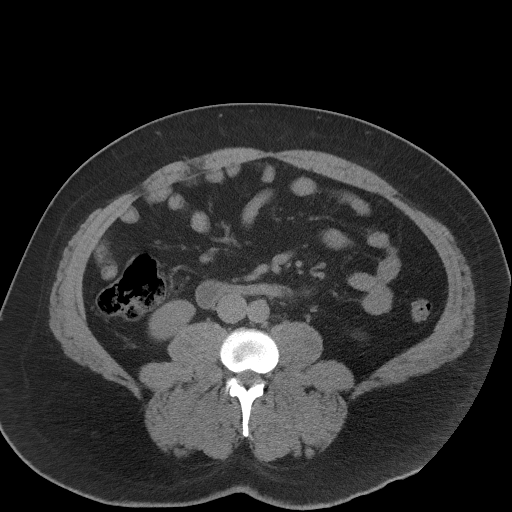
[im 71/111  soft-tissue]
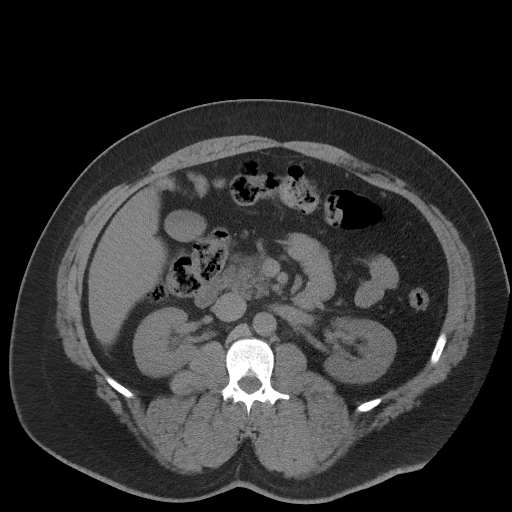
[im 71/111  bone]
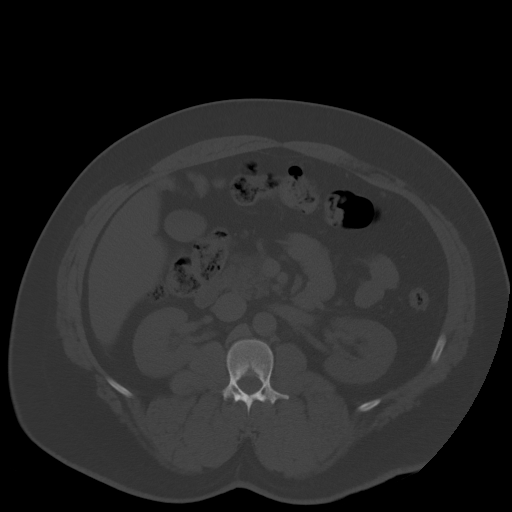
[im 80/111  soft-tissue]
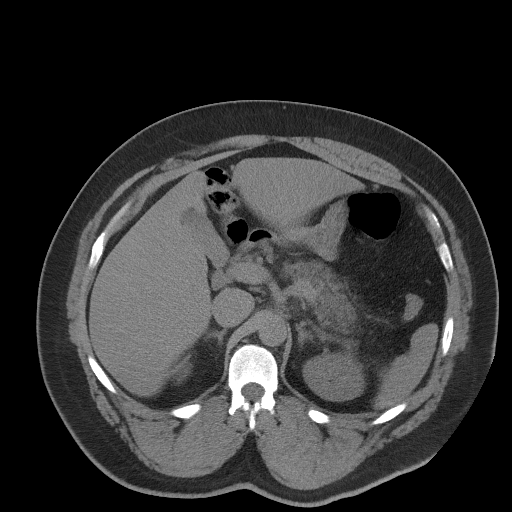
[im 89/111  soft-tissue]
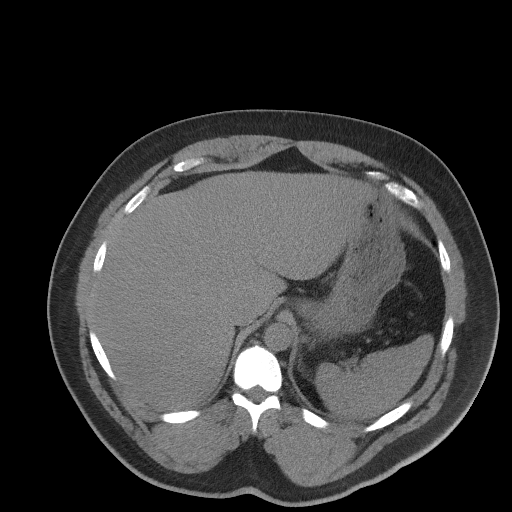
[im 97/111  soft-tissue]
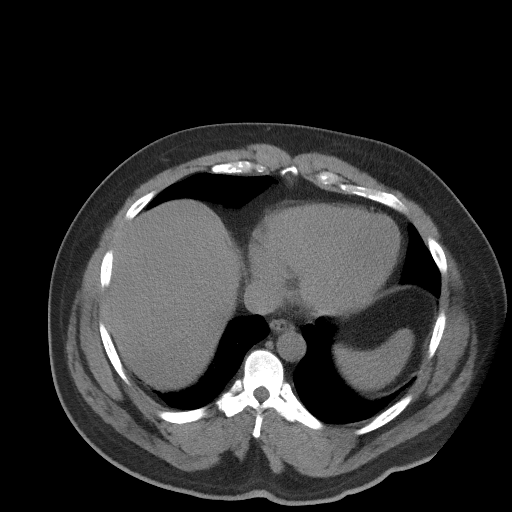
[im 106/111  soft-tissue]
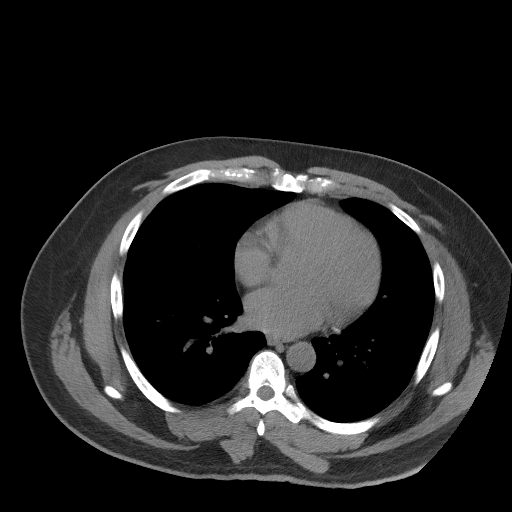

[Series 5: coronal · coronal · 0.96mm/px · 3 of 172 slices shown]
[im 58/172  soft-tissue]
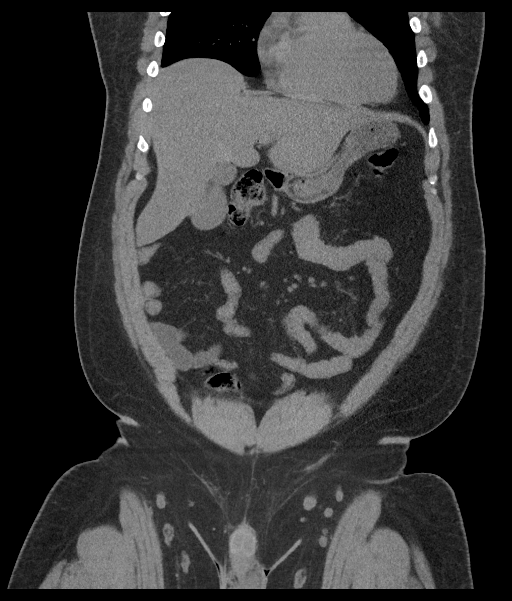
[im 77/172  soft-tissue]
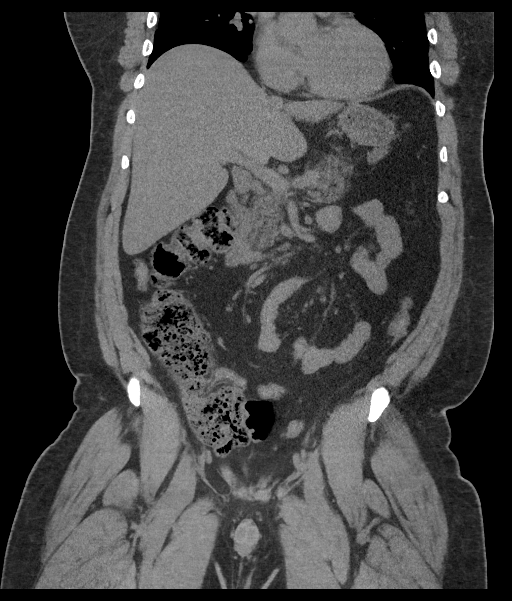
[im 96/172  soft-tissue]
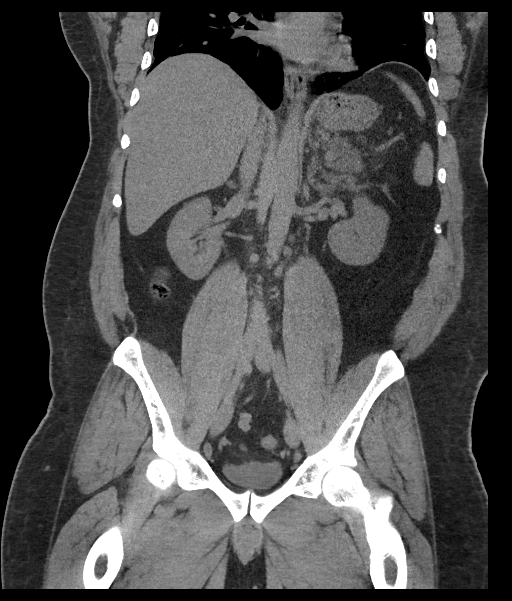

[16 of 46 positions shown; findings below may reference images not displayed]

FINDINGS: Lower chest: Mild upper and atelectasis.  No pleural fluid.

Hepatobiliary: Hepatomegaly. Hepatic steatosis with focal fatty
sparing adjacent to the gallbladder fossa. Question intraluminal
sludge, no calcified stone. No biliary dilatation.

Pancreas: Peripancreatic edema and soft tissue stranding about the
distal body and tail. No ductal dilatation. No peripancreatic fluid
collection. Cannot assess for enhancement given lack of contrast.

Spleen: Upper normal in size. No focal abnormality on noncontrast
exam.

Adrenals/Urinary Tract: No adrenal nodule. No hydronephrosis or
perinephric edema. No urolithiasis. Both ureters are decompressed.
Urinary bladder is minimally distended. No bladder stone.

Stomach/Bowel: The stomach is decompressed. No small bowel
dilatation or inflammation. Small volume of colonic stool. Appendix
not confidently identified, no pericecal inflammation.

Vascular/Lymphatic: No significant vascular findings are present. No
enlarged abdominal or pelvic lymph nodes.

Reproductive: Prostate is unremarkable.

Other: No ascites or free air. Minimal fat in the left greater than
right inguinal canal.

Musculoskeletal: There are no acute or suspicious osseous
abnormalities. Scattered degenerative change in the spine.
IMPRESSION: 1. Acute edematous pancreatitis.
2.  No renal stones or obstructive uropathy.
3. Hepatic steatosis.

## 2019-01-11 ENCOUNTER — Emergency Department
Admission: EM | Admit: 2019-01-11 | Discharge: 2019-01-11 | Disposition: A | Discharge status: Home / Self Care | Payer: Commercial Managed Care - PPO | Attending: Emergency Medicine | Admitting: Emergency Medicine

## 2019-01-11 ENCOUNTER — Emergency Department: Payer: Commercial Managed Care - PPO

## 2019-01-11 ENCOUNTER — Encounter: Payer: Self-pay | Admitting: Emergency Medicine

## 2019-01-11 ENCOUNTER — Other Ambulatory Visit: Payer: Self-pay

## 2019-01-11 DIAGNOSIS — R197 Diarrhea, unspecified: Secondary | ICD-10-CM | POA: Diagnosis not present

## 2019-01-11 DIAGNOSIS — R112 Nausea with vomiting, unspecified: Secondary | ICD-10-CM

## 2019-01-11 DIAGNOSIS — Z79899 Other long term (current) drug therapy: Secondary | ICD-10-CM | POA: Diagnosis not present

## 2019-01-11 DIAGNOSIS — R0789 Other chest pain: Secondary | ICD-10-CM | POA: Insufficient documentation

## 2019-01-11 DIAGNOSIS — Z7984 Long term (current) use of oral hypoglycemic drugs: Secondary | ICD-10-CM | POA: Insufficient documentation

## 2019-01-11 DIAGNOSIS — E119 Type 2 diabetes mellitus without complications: Secondary | ICD-10-CM | POA: Insufficient documentation

## 2019-01-11 LAB — CBC WITH DIFFERENTIAL/PLATELET
Abs Immature Granulocytes: 0.05 10*3/uL (ref 0.00–0.07)
BASOS ABS: 0 10*3/uL (ref 0.0–0.1)
BASOS PCT: 0 %
EOS PCT: 2 %
Eosinophils Absolute: 0.2 10*3/uL (ref 0.0–0.5)
HCT: 47.8 % (ref 39.0–52.0)
HEMOGLOBIN: 15.5 g/dL (ref 13.0–17.0)
Immature Granulocytes: 0 %
LYMPHS PCT: 8 %
Lymphs Abs: 1 10*3/uL (ref 0.7–4.0)
MCH: 28.4 pg (ref 26.0–34.0)
MCHC: 32.4 g/dL (ref 30.0–36.0)
MCV: 87.7 fL (ref 80.0–100.0)
MONO ABS: 0.7 10*3/uL (ref 0.1–1.0)
Monocytes Relative: 6 %
NRBC: 0 % (ref 0.0–0.2)
Neutro Abs: 11 10*3/uL — ABNORMAL HIGH (ref 1.7–7.7)
Neutrophils Relative %: 84 %
PLATELETS: 277 10*3/uL (ref 150–400)
RBC: 5.45 MIL/uL (ref 4.22–5.81)
RDW: 12.4 % (ref 11.5–15.5)
WBC: 13 10*3/uL — AB (ref 4.0–10.5)

## 2019-01-11 LAB — COMPREHENSIVE METABOLIC PANEL
ALK PHOS: 89 U/L (ref 38–126)
ALT: 18 U/L (ref 0–44)
ANION GAP: 9 (ref 5–15)
AST: 20 U/L (ref 15–41)
Albumin: 4.7 g/dL (ref 3.5–5.0)
BUN: 22 mg/dL — ABNORMAL HIGH (ref 6–20)
CALCIUM: 9 mg/dL (ref 8.9–10.3)
CHLORIDE: 107 mmol/L (ref 98–111)
CO2: 21 mmol/L — AB (ref 22–32)
Creatinine, Ser: 1.44 mg/dL — ABNORMAL HIGH (ref 0.61–1.24)
GFR, EST NON AFRICAN AMERICAN: 57 mL/min — AB (ref >60)
Glucose, Bld: 187 mg/dL — ABNORMAL HIGH (ref 70–99)
Potassium: 4 mmol/L (ref 3.5–5.1)
SODIUM: 137 mmol/L (ref 135–145)
Total Bilirubin: 0.3 mg/dL (ref 0.3–1.2)
Total Protein: 8.8 g/dL — ABNORMAL HIGH (ref 6.5–8.1)

## 2019-01-11 LAB — LIPASE, BLOOD: LIPASE: 20 U/L (ref 11–51)

## 2019-01-11 LAB — TROPONIN I

## 2019-01-11 MED ORDER — ONDANSETRON HCL 4 MG/2ML IJ SOLN
4.0000 mg | INTRAMUSCULAR | Status: AC
  Administered 2019-01-11: 4 mg via INTRAVENOUS
  Filled 2019-01-11: qty 2

## 2019-01-11 MED ORDER — SODIUM CHLORIDE 0.9% FLUSH: 3.0000 mL | Freq: Once | INTRAVENOUS | Status: DC

## 2019-01-11 MED ORDER — SODIUM CHLORIDE 0.9 % IV BOLUS
1000.0000 mL | Freq: Once | INTRAVENOUS | Status: AC
  Administered 2019-01-11: 1000 mL via INTRAVENOUS

## 2019-01-11 MED ORDER — ONDANSETRON HCL 4 MG/2ML IJ SOLN
4.0000 mg | INTRAMUSCULAR | Status: AC
Start: 2019-01-11 — End: 2019-01-11
  Administered 2019-01-11: 4 mg via INTRAVENOUS
  Filled 2019-01-11: qty 2

## 2019-01-11 MED ORDER — ONDANSETRON 4 MG PO TBDP: ORAL_TABLET | ORAL | 0 refills | Status: AC

## 2019-01-11 NOTE — ED Triage Notes (Addendum)
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

## 2019-01-11 NOTE — ED Notes (Signed)
Pt stated that the main reason he came tonight was because of the nausea feeling and weakness. Pt stated that everything stated on Saturday. Denies any vomiting since 1500 on Saturday.

## 2019-01-11 NOTE — Discharge Instructions (Signed)

## 2019-01-11 NOTE — ED Provider Notes (Signed)
[facility] Emergency Department Provider Note  ____________________________________________   None    (approximate)  I have reviewed the triage vital signs and the nursing notes.   HISTORY  Chief Complaint Emesis; Diarrhea; Dizziness; and Chest Pain    HPI Gary Holloway is a 48 y.o. male with medical history as listed below who presents for evaluation of persistent nausea, vomiting, and diarrhea over the course of the day today with left-sided chest pain.  He reports that the nausea has been present for a couple of days but was not a big problem.  Today, however, he started having persistent vomiting and multiple episodes of diarrhea.  He is also had loose stools for a couple of days but it did not become profuse until today.  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  He has been staying away from people as much as possible given the COVID-19 pandemic.  He denies fever, neck pain, neck stiffness, nasal congestion, sore throat, shortness of breath, cough, and abdominal pain.  He says that the chest pain is a tingling sensation in the left upper part of his chest that occurs randomly and has been going on for almost 12 hours, sometimes when he moves around or changes positions, and nothing in particular makes it better or worse.  He describes the GI symptoms as severe and nothing in particular makes them better or worse either.  After all of the vomiting and diarrhea he feels a little bit weak and dizzy   When he stands up or tries to walk.  He sees a provider at [facility] for weight loss and takes Topamax and phentermine but has not had any medication changes for at least 3 weeks.  He does not drink alcohol and does not use drugs.  Multiple family members have had cardiac issues but he has no personal history of cardiac disease.  He deals with obesity and diabetes.       Past Medical History:  Diagnosis Date  . Diabetes mellitus without complication (HCC)   . Morbid obesity (HCC)   . Thyroid nodule   . Thyrotoxicosis without thyroid storm     Patient Active Problem List   Diagnosis Date Noted  . Leukocytosis 10/10/2016  . Hyponatremia 10/10/2016  . Alcohol abuse 10/10/2016  . Diabetes (HCC) 10/10/2016  . Acute pancreatitis 10/09/2016  . Multiple thyroid nodules 11/28/2015  . Morbid obesity due to excess calories (HCC) 04/07/2015  . Type 2 diabetes mellitus without complication (HCC) 04/07/2015  . Constipation 08/25/2013  . Fatigue 08/25/2013  . Morning headache 08/25/2013  . Back pain 04/21/2013  . BPH associated with nocturia 04/21/2013  . Enlarged prostate with lower urinary tract symptoms (LUTS) 04/21/2013  . Knee pain, bilateral 04/21/2013  . Obesity (BMI 35.0-39.9 without comorbidity) 04/21/2013  . 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 ABDOMINAL SURGERY    . APPENDECTOMY    . HERNIA REPAIR      Prior to Admission medications   Medication Sig Start Date End Date Taking? Authorizing Provider  diphenhydrAMINE (BENADRYL) 25 mg capsule Take 1 capsule (25 mg total) by mouth every 6 (six) hours as needed for itching or allergies. 10/11/16  Yes Katharina Caper, MD  fluticasone Aleda Grana) 50 MCG/ACT nasal spray Reported on 11/28/2015 03/15/15  Yes [provider]  lovastatin (MEVACOR) 10 MG tablet Take 1 tablet by mouth daily. 08/06/16  Yes [provider]  metFORMIN (GLUCOPHAGE-XR) 750 MG 24 hr tablet Take 750 mg by mouth daily with supper.   Yes [provider]  methimazole (TAPAZOLE) 10 MG tablet Take 1 tablet by mouth once daily 08/25/13  Yes [provider]  phentermine 37.5 MG capsule Take 37.5 mg by mouth every morning.   Yes [provider]  polyethylene glycol (MIRALAX / GLYCOLAX) packet Take 17 g by mouth daily. 10/12/16   Yes Katharina Caper, MD  senna-docusate (SENOKOT-S) 8.6-50 MG tablet Take 1 tablet by mouth at bedtime as needed for mild constipation. 10/10/16  Yes Katharina Caper, MD  ondansetron (ZOFRAN ODT) 4 MG disintegrating tablet Allow 1-2 tablets to dissolve in your mouth every 8 hours as needed for nausea/vomiting 01/11/19   Loleta Rose, MD    Allergies Iodine; Contrast media  [iodinated diagnostic agents]; and Gadolinium  Family History  Problem Relation Age of Onset  . Diabetes Mother   . Hypertension Mother   . Diabetes Father   . Heart failure Father   . Kidney disease Father     Social History Social History   Tobacco Use  . Smoking status: Never Smoker  . Smokeless tobacco: Never Used  Substance Use Topics  . Alcohol use: Yes    Comment: "rarely"  . Drug use: No    Review of Systems Constitutional: No fever/chills Eyes: No visual changes. ENT: No sore throat. Cardiovascular: Left-sided chest tingling. Respiratory: Denies shortness of breath. Gastrointestinal: No abdominal pain, persistent nausea, vomiting, and diarrhea over the course of the day. Genitourinary: Negative for dysuria. Musculoskeletal: Negative for neck pain.  Negative for back pain. Integumentary: Negative for rash. Neurological: Negative for headaches, focal weakness or numbness.   ____________________________________________   PHYSICAL EXAM:  VITAL SIGNS: ED Triage Vitals  Enc Vitals Group     BP 01/11/19 0121 (!) 140/114     Pulse Rate 01/11/19 0121 99     Resp 01/11/19 0121 16     Temp 01/11/19 0121 98 F (36.7 C)     Temp Source 01/11/19 0121 Oral     SpO2 01/11/19 0121 98 %     Weight 01/11/19 0122 122.5 kg (270 lb)     Height 01/11/19 0122 1.816 m (5' 11.5")     Head Circumference --      Peak Flow --      Pain Score 01/11/19 0122 5     Pain Loc --      Pain Edu? --      Excl. in GC? --     Constitutional: Alert and oriented.  Appears generally well but does seem a little bit  uncomfortable. Eyes: Conjunctivae are normal.  Head: Atraumatic. Nose: No congestion/rhinnorhea. Mouth/Throat: Mucous membranes are tacky. Neck: No stridor.  No meningeal signs.   Cardiovascular: Normal rate, regular rhythm. Good peripheral circulation. Grossly normal heart sounds. Respiratory: Normal respiratory effort.  No retractions. Lungs CTAB. Gastrointestinal: Soft and nontender. No distention.  Musculoskeletal: No lower extremity tenderness nor edema. No gross deformities of extremities. Neurologic:  Normal speech and language. No gross focal neurologic deficits are appreciated.  Skin:  Skin is warm, dry and intact. No rash noted. Psychiatric: Mood and affect are normal. Speech and behavior are normal.  ____________________________________________   LABS (all labs ordered are listed, but only abnormal results are displayed)  Labs Reviewed  COMPREHENSIVE METABOLIC PANEL - Abnormal; Notable for the following components:  Result Value   CO2 21 (*)    Glucose, Bld 187 (*)    BUN 22 (*)    Creatinine, Ser 1.44 (*)    Total Protein 8.8 (*)    GFR calc non Af Amer 57 (*)    All other components within normal limits  CBC WITH DIFFERENTIAL/PLATELET - Abnormal; Notable for the following components:   WBC 13.0 (*)    Neutro Abs 11.0 (*)    All other components within normal limits  LIPASE, BLOOD  TROPONIN I   ____________________________________________  EKG  ED ECG REPORT I, Loleta Rose, the attending physician, personally viewed and interpreted this ECG.  Date: 01/11/2019 EKG Time: 1:21 AM Rate: 95 Rhythm: normal sinus rhythm QRS Axis: normal Intervals: normal ST/T Wave abnormalities: normal Narrative Interpretation: no evidence of acute ischemia  ____________________________________________  RADIOLOGY   ED MD interpretation:  No acute abnormality on CXR  Official radiology report(s): Dg Chest 2 View  Result Date: 01/11/2019 CLINICAL DATA:   48 year old male with intermittent nausea and dizziness. EXAM: CHEST - 2 VIEW COMPARISON:  None. FINDINGS: There is mild eventration of the left hemidiaphragm. There is no focal consolidation, pleural effusion, or pneumothorax. The cardiac silhouette is within normal limits. No acute osseous pathology. IMPRESSION: No active cardiopulmonary disease. Electronically Signed   By: Elgie Collard M.D.   On: 01/11/2019 02:16    ____________________________________________   PROCEDURES   Procedure(s) performed (including Critical Care):  Procedures   ____________________________________________   INITIAL IMPRESSION / MDM / ASSESSMENT AND PLAN / ED COURSE  As part of my medical decision making, I reviewed the following data within the electronic MEDICAL RECORD NUMBER Nursing notes reviewed and incorporated, Labs reviewed , EKG interpreted , Old chart reviewed, Radiograph reviewed , Notes from prior ED visits and [place] Controlled Substance Database  Gary Holloway was evaluated in Emergency Department on 01/11/2019 for the symptoms described in the history of present illness. He was evaluated in the context of the global COVID-19 pandemic, which necessitated consideration that the patient might be at risk for infection with the SARS-CoV-2 virus that causes COVID-19. Institutional protocols and algorithms that pertain to the evaluation of patients at risk for COVID-19 are in a state of rapid change based on information released by regulatory bodies including the CDC and federal and state organizations. These policies and algorithms were followed during the patient's care in the ED.      Differential diagnosis includes, but is not limited to, viral gastroenteritis, foodborne pathogen, medication side effect, ACS, alcoholic gastritis/gastroenteritis, pancreatitis, gallbladder disease.  The patient has no tenderness to palpation of his abdomen including a negative Murphy's sign and no tenderness at McBurney's  point.  His symptoms are primarily nausea and vomiting as well as some diarrhea and the chest pain is very atypical.  He is low risk for ACS based on HEART score and he has a normal EKG with no signs of ischemia.  I am treating with 1 L normal saline and Zofran 4 mg IV while awaiting the results of his broad laboratory evaluation.  Anticipate discharge and outpatient follow-up if he is feeling better and able to tolerate fluids.  There is no indication for imaging at this time.  Given that he has been having intermittent atypical chest pain for almost 12 hours there is no indication for repeat troponin particular given his low risk.  He understands and agrees with the plan.  Clinical Course as of Jan 11 252  Mon Jan 11, 2019  0150 Of note, the patient is having no respiratory symptoms, is afebrile, and is low risk for COVID-19 based on current prevalence patterns and there is no indication for testing based on current hospital mandated and state recommended guidelines.   [CF]  0209 Troponin I: <0.03 [CF]  0209 Although slightly elevated, the creatinine of 1.4 is consistent with the last few creatinine measurements on record from his outpatient doctor over the last few months.  No indication of acute kidney injury at this time.  Creatinine(!): 1.44 [CF]  0209 WBC(!): 13.0 [CF]  0251 Evaluation is reassuring.  The patient has not had any vomiting since coming to the emergency department.  He still reports some mild nausea and I am giving Zofran 4 mg IV for a second dose.  I went over my return precautions and recommendations and will prescribe the patient some Zofran as an outpatient.  He understands and agrees with the plan.   [CF]    Clinical Course User Index [CF] Loleta Rose, MD    ____________________________________________  FINAL CLINICAL IMPRESSION(S) / ED DIAGNOSES  Final diagnoses:  Nausea vomiting and diarrhea  Atypical chest pain     MEDICATIONS GIVEN DURING THIS VISIT:   Medications  sodium chloride flush (NS) 0.9 % injection 3 mL (has no administration in time range)  sodium chloride 0.9 % bolus 1,000 mL (1,000 mLs Intravenous New Bag/Given 01/11/19 0151)  ondansetron (ZOFRAN) injection 4 mg (4 mg Intravenous Given 01/11/19 0151)  ondansetron (ZOFRAN) injection 4 mg (4 mg Intravenous Given 01/11/19 0249)     ED Discharge Orders         Ordered    ondansetron (ZOFRAN ODT) 4 MG disintegrating tablet     01/11/19 0252           Note:  This document was prepared using Dragon voice recognition software and may include unintentional dictation errors.   Loleta Rose, MD 01/11/19 [phone]

## 2019-05-28 ENCOUNTER — Encounter: Payer: Self-pay | Admitting: Emergency Medicine

## 2019-05-28 ENCOUNTER — Other Ambulatory Visit: Payer: Self-pay

## 2019-05-28 ENCOUNTER — Emergency Department
Admission: EM | Admit: 2019-05-28 | Discharge: 2019-05-28 | Disposition: A | Discharge status: Home / Self Care | Payer: Commercial Managed Care - PPO | Attending: Emergency Medicine | Admitting: Emergency Medicine

## 2019-05-28 ENCOUNTER — Emergency Department: Payer: Commercial Managed Care - PPO

## 2019-05-28 DIAGNOSIS — N23 Unspecified renal colic: Secondary | ICD-10-CM | POA: Insufficient documentation

## 2019-05-28 DIAGNOSIS — R1032 Left lower quadrant pain: Secondary | ICD-10-CM | POA: Diagnosis present

## 2019-05-28 DIAGNOSIS — Z7984 Long term (current) use of oral hypoglycemic drugs: Secondary | ICD-10-CM | POA: Diagnosis not present

## 2019-05-28 DIAGNOSIS — E119 Type 2 diabetes mellitus without complications: Secondary | ICD-10-CM | POA: Insufficient documentation

## 2019-05-28 DIAGNOSIS — R110 Nausea: Secondary | ICD-10-CM | POA: Diagnosis not present

## 2019-05-28 LAB — URINALYSIS, COMPLETE (UACMP) WITH MICROSCOPIC
Bilirubin Urine: NEGATIVE
Glucose, UA: NEGATIVE mg/dL
Ketones, ur: NEGATIVE mg/dL
Leukocytes,Ua: NEGATIVE
Nitrite: NEGATIVE
Protein, ur: NEGATIVE mg/dL
RBC / HPF: >50 RBC/hpf — ABNORMAL HIGH (ref 0–5)
Specific Gravity, Urine: 1.018 (ref 1.005–1.030)
Squamous Epithelial / HPF: NONE SEEN (ref 0–5)
WBC, UA: NONE SEEN WBC/hpf (ref 0–5)
pH: 6 (ref 5.0–8.0)

## 2019-05-28 LAB — CBC
HCT: 46.5 % (ref 39.0–52.0)
Hemoglobin: 15.2 g/dL (ref 13.0–17.0)
MCH: 28.5 pg (ref 26.0–34.0)
MCHC: 32.7 g/dL (ref 30.0–36.0)
MCV: 87.2 fL (ref 80.0–100.0)
Platelets: 248 10*3/uL (ref 150–400)
RBC: 5.33 MIL/uL (ref 4.22–5.81)
RDW: 12.8 % (ref 11.5–15.5)
WBC: 8.6 10*3/uL (ref 4.0–10.5)
nRBC: 0 % (ref 0.0–0.2)

## 2019-05-28 LAB — BASIC METABOLIC PANEL
Anion gap: 8 (ref 5–15)
BUN: 19 mg/dL (ref 6–20)
CO2: 21 mmol/L — ABNORMAL LOW (ref 22–32)
Calcium: 9.2 mg/dL (ref 8.9–10.3)
Chloride: 108 mmol/L (ref 98–111)
Creatinine, Ser: 1.3 mg/dL — ABNORMAL HIGH (ref 0.61–1.24)
GFR calc Af Amer: >60 mL/min (ref >60)
GFR calc non Af Amer: >60 mL/min (ref >60)
Glucose, Bld: 133 mg/dL — ABNORMAL HIGH (ref 70–99)
Potassium: 3.7 mmol/L (ref 3.5–5.1)
Sodium: 137 mmol/L (ref 135–145)

## 2019-05-28 MED ORDER — TAMSULOSIN HCL 0.4 MG PO CAPS: 0.4000 mg | ORAL_CAPSULE | Freq: Every day | ORAL | 0 refills | Status: AC

## 2019-05-28 MED ORDER — KETOROLAC TROMETHAMINE 30 MG/ML IJ SOLN
30.0000 mg | Freq: Once | INTRAMUSCULAR | Status: AC
  Administered 2019-05-28: 30 mg via INTRAVENOUS
  Filled 2019-05-28: qty 1

## 2019-05-28 MED ORDER — MORPHINE SULFATE (PF) 4 MG/ML IV SOLN
4.0000 mg | Freq: Once | INTRAVENOUS | Status: AC
  Administered 2019-05-28: 4 mg via INTRAVENOUS
  Filled 2019-05-28: qty 1

## 2019-05-28 MED ORDER — OXYCODONE-ACETAMINOPHEN 5-325 MG PO TABS: 1.0000 | ORAL_TABLET | ORAL | 0 refills | Status: AC | PRN

## 2019-05-28 MED ORDER — ONDANSETRON HCL 4 MG/2ML IJ SOLN
4.0000 mg | Freq: Once | INTRAMUSCULAR | Status: AC
  Administered 2019-05-28: 4 mg via INTRAVENOUS
  Filled 2019-05-28: qty 2

## 2019-05-28 NOTE — ED Triage Notes (Signed)
Pt to ED via POV c/o sudden onset left flank pain and LLQ abd pain that started this morning. Pt states that he is having nausea as well. Pt reports that pain increases in intensity at times. Pt states that he is able to urinate but only passing small amounts. Pt denies painful urination or hx/o kidney stones. Pt is in NAD.

## 2019-05-28 NOTE — Discharge Instructions (Addendum)
Call the urologist to arrange follow-up in about a week.  Please strain all your urine.  Try to catch the kidney stone.  Take it with you to see the urologist.  They may want to have it analyzed.  Return for worse pain fever vomiting or feeling sicker.  Take the Percocet 1 pill 4 times a day as needed for pain.  Be careful can make you sleepy and constipated.  Do not drive on it.  Also take the Flomax 1 pill once a day to help the stone pass.

## 2019-05-28 NOTE — ED Provider Notes (Signed)
[facility] Emergency Department Provider Note  ____________________________________________   First MD Initiated Contact with Patient 05/28/19 1328     (approximate)  I have reviewed the triage vital signs and the nursing notes.   HISTORY  Chief Complaint Flank Pain    HPI Gary Holloway is a 48 y.o. 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  The pain is now somewhat better.  He has some dysuria associated with it.  He is never had anything like this before.  The pain was severe but is now only moderate.  Deep and achy        Past Medical History:  Diagnosis Date   Diabetes mellitus without complication (HCC)    Morbid obesity (HCC)    Thyroid nodule    Thyrotoxicosis without thyroid storm     Patient Active Problem List   Diagnosis Date Noted   Leukocytosis 10/10/2016   Hyponatremia 10/10/2016   Alcohol abuse 10/10/2016   Diabetes (HCC) 10/10/2016   Acute pancreatitis 10/09/2016   Multiple thyroid nodules 11/28/2015   Morbid obesity due to excess calories (HCC) 04/07/2015   Type 2 diabetes mellitus without complication (HCC) 04/07/2015   Constipation 08/25/2013   Fatigue 08/25/2013   Morning headache 08/25/2013   Back pain 04/21/2013   BPH associated with nocturia 04/21/2013   Enlarged prostate with lower urinary tract symptoms (LUTS) 04/21/2013   Knee pain, bilateral 04/21/2013   Obesity (BMI 35.0-39.9 without comorbidity) 04/21/2013   Thyrotoxicosis without thyroid storm 10/03/2010    Past Surgical History:  Procedure Laterality Date   ABDOMINAL SURGERY     APPENDECTOMY     HERNIA REPAIR      Prior to Admission medications   Medication Sig Start Date End Date Taking? Authorizing Provider  diphenhydrAMINE (BENADRYL) 25 mg capsule Take 1 capsule (25 mg total) by mouth every 6 (six) hours as needed for itching or allergies.  10/11/16   Katharina CaperVaickute, Rima, MD  fluticasone Aleda Grana(FLONASE) 50 MCG/ACT nasal spray Reported on 11/28/2015 03/15/15   [provider]  lovastatin (MEVACOR) 10 MG tablet Take 1 tablet by mouth daily. 08/06/16   [provider]  metFORMIN (GLUCOPHAGE-XR) 750 MG 24 hr tablet Take 750 mg by mouth daily with supper.    [provider]  methimazole (TAPAZOLE) 10 MG tablet Take 1 tablet by mouth once daily 08/25/13   [provider]  ondansetron (ZOFRAN ODT) 4 MG disintegrating tablet Allow 1-2 tablets to dissolve in your mouth every 8 hours as needed for nausea/vomiting 01/11/19   Loleta RoseForbach, Cory, MD  oxyCODONE-acetaminophen (PERCOCET) 5-325 MG tablet Take 1 tablet by mouth every 4 (four) hours as needed for severe pain. 05/28/19 05/27/20  Arnaldo NatalMalinda, Wanda Rideout F, MD  phentermine 37.5 MG capsule Take 37.5 mg by mouth every morning.    [provider]  polyethylene glycol (MIRALAX / GLYCOLAX) packet Take 17 g by mouth daily. 10/12/16   Katharina CaperVaickute, Rima, MD  senna-docusate (SENOKOT-S) 8.6-50 MG tablet Take 1 tablet by mouth at bedtime as needed for mild constipation. 10/10/16   Katharina CaperVaickute, Rima, MD  tamsulosin (FLOMAX) 0.4 MG CAPS capsule Take 1 capsule (0.4 mg total) by mouth daily. 05/28/19   Arnaldo NatalMalinda, Octavian Godek F, MD    Allergies Iodine, Contrast media  [iodinated diagnostic agents], and Gadolinium  Family History  Problem Relation Age of Onset   Diabetes Mother    Hypertension Mother    Diabetes Father  Heart failure Father    Kidney disease Father     Social History Social History   Tobacco Use   Smoking status: Never Smoker   Smokeless tobacco: Never Used  Substance Use Topics   Alcohol use: Yes    Comment: "rarely"   Drug use: No    Review of Systems  Constitutional: No fever/chills Eyes: No visual changes. ENT: No sore throat. Cardiovascular: Denies chest pain. Respiratory: Denies shortness of breath. Gastrointestinal: abdominal pain.  No nausea, no  vomiting.  No diarrhea.  No constipation. Genitourinary:  dysuria. Musculoskeletal: Negative for back pain. Skin: Negative for rash. Neurological: Negative for headaches, focal weakness   ____________________________________________   PHYSICAL EXAM:  VITAL SIGNS: ED Triage Vitals  Enc Vitals Group     BP 05/28/19 1203 135/89     Pulse Rate 05/28/19 1203 76     Resp 05/28/19 1203 16     Temp 05/28/19 1203 97.7 F (36.5 C)     Temp Source 05/28/19 1203 Oral     SpO2 05/28/19 1203 98 %     Weight 05/28/19 1204 275 lb (124.7 kg)     Height 05/28/19 1204 5\' 11"  (1.803 m)     Head Circumference --      Peak Flow --      Pain Score 05/28/19 1203 9     Pain Loc --      Pain Edu? --      Excl. in GC? --     Constitutional: Alert and oriented.  Looks uncomfortable Eyes: Conjunctivae are normal.  Head: Atraumatic. Nose: No congestion/rhinnorhea. Mouth/Throat: Mucous membranes are moist.  Oropharynx non-erythematous. Neck: No stridor.  Cardiovascular: Normal rate, regular rhythm. Grossly normal heart sounds.  Good peripheral circulation. Respiratory: Normal respiratory effort.  No retractions. Lungs CTAB. Gastrointestinal: Soft mild tenderness left lower quadrant no distention. No abdominal bruits.  Left CVA tenderness. Musculoskeletal: No lower extremity tenderness nor edema.  No joint effusions. Neurologic:  Normal speech and language. No gross focal neurologic deficits are appreciated. No gait instability. Skin:  Skin is warm, dry and intact. No rash noted. Psychiatric: Mood and affect are normal. Speech and behavior are normal.  ____________________________________________   LABS (all labs ordered are listed, but only abnormal results are displayed)  Labs Reviewed  URINALYSIS, COMPLETE (UACMP) WITH MICROSCOPIC - Abnormal; Notable for the following components:      Result Value   Color, Urine YELLOW (*)    APPearance CLEAR (*)    Hgb urine dipstick LARGE (*)    RBC /  HPF >50 (*)    Bacteria, UA RARE (*)    All other components within normal limits  BASIC METABOLIC PANEL - Abnormal; Notable for the following components:   CO2 21 (*)    Glucose, Bld 133 (*)    Creatinine, Ser 1.30 (*)    All other components within normal limits  CBC   ____________________________________________  EKG   ____________________________________________  RADIOLOGY  ED MD interpretation: CT shows a 3 mm distal ureteral stone.  Official radiology report(s): Ct Renal Stone Study  Result Date: 05/28/2019 CLINICAL DATA:  Left-sided flank pain EXAM: CT ABDOMEN AND PELVIS WITHOUT CONTRAST TECHNIQUE: Multidetector CT imaging of the abdomen and pelvis was performed following the standard protocol without IV contrast. COMPARISON:  10/09/2016 FINDINGS: Lower chest: No acute abnormality. Hepatobiliary: No focal liver abnormality is seen. No gallstones, gallbladder wall thickening, or biliary dilatation. Pancreas: Unremarkable. No pancreatic ductal dilatation or surrounding inflammatory changes. Spleen: Normal  in size without focal abnormality. Adrenals/Urinary Tract: Adrenal glands are within normal limits bilaterally. Kidneys are well visualize without evidence of renal calculi. Mild fullness of the left renal collecting system is seen which extends to just above the ureterovesical junction. A small 3 mm obstructing stone is noted in the distal left ureter. The bladder is decompressed. Stomach/Bowel: The appendix is within normal limits. No obstructive or inflammatory changes of the colon or small bowel are seen. The stomach is unremarkable. Vascular/Lymphatic: No significant vascular findings are present. No enlarged abdominal or pelvic lymph nodes. Reproductive: Prostate is unremarkable. Other: No abdominal wall hernia or abnormality. No abdominopelvic ascites. Musculoskeletal: No acute or significant osseous findings. IMPRESSION: 3 mm distal left ureteral stone with mild obstructive  change. Electronically Signed   By: Inez Catalina M.D.   On: 05/28/2019 14:05    ____________________________________________   PROCEDURES  Procedure(s) performed (including Critical Care):  Procedures   ____________________________________________   INITIAL IMPRESSION / ASSESSMENT AND PLAN / ED COURSE  Ifeoluwa Beller was evaluated in Emergency Department on 05/28/2019 for the symptoms described in the history of present illness. He was evaluated in the context of the global COVID-19 pandemic, which necessitated consideration that the patient might be at risk for infection with the SARS-CoV-2 virus that causes COVID-19. Institutional protocols and algorithms that pertain to the evaluation of patients at risk for COVID-19 are in a state of rapid change based on information released by regulatory bodies including the CDC and federal and state organizations. These policies and algorithms were followed during the patient's care in the ED.    Patient with CT showing a kidney stone.  Symptoms consistent with renal colic.  His pain is better with medicines.  We will let him go follow-up with urology take Percocet strain his urine.  He will return for any further problems.         ____________________________________________   FINAL CLINICAL IMPRESSION(S) / ED DIAGNOSES  Final diagnoses:  Renal colic on left side     ED Discharge Orders         Ordered    oxyCODONE-acetaminophen (PERCOCET) 5-325 MG tablet  Every 4 hours PRN     05/28/19 1425    tamsulosin (FLOMAX) 0.4 MG CAPS capsule  Daily     05/28/19 1425           Note:  This document was prepared using Dragon voice recognition software and may include unintentional dictation errors.    Nena Polio, MD 05/28/19 2258
